# Patient Record
Sex: Female | Born: 1970 | Race: Asian | Hispanic: No | Marital: Married | Smoking: Never smoker
Health system: Southern US, Community
[De-identification: ages and names within clinical notes are randomized; demographics above are authoritative.]

## PROBLEM LIST (undated history)

## (undated) ENCOUNTER — Inpatient Hospital Stay (HOSPITAL_COMMUNITY): Payer: Self-pay

## (undated) DIAGNOSIS — O2340 Unspecified infection of urinary tract in pregnancy, unspecified trimester: Secondary | ICD-10-CM

## (undated) HISTORY — PX: NO PAST SURGERIES: SHX2092

---

## 2010-02-24 ENCOUNTER — Ambulatory Visit: Payer: Self-pay | Admitting: Physician Assistant

## 2010-02-24 DIAGNOSIS — K219 Gastro-esophageal reflux disease without esophagitis: Secondary | ICD-10-CM | POA: Insufficient documentation

## 2010-02-24 DIAGNOSIS — R319 Hematuria, unspecified: Secondary | ICD-10-CM

## 2010-02-24 LAB — CONVERTED CEMR LAB
BUN: 12 mg/dL (ref 6–23)
Basophils Relative: 0 % (ref 0–1)
Bilirubin Urine: NEGATIVE
CO2: 25 meq/L (ref 19–32)
Calcium: 8.7 mg/dL (ref 8.4–10.5)
Chloride: 104 meq/L (ref 96–112)
Creatinine, Ser: 0.65 mg/dL (ref 0.40–1.20)
Crystals: NONE SEEN
Glucose, Urine, Semiquant: NEGATIVE
HCT: 37.3 % (ref 36.0–46.0)
Hemoglobin: 12.1 g/dL (ref 12.0–15.0)
Ketones, urine, test strip: NEGATIVE
Lymphocytes Relative: 34 % (ref 12–46)
MCHC: 32.4 g/dL (ref 30.0–36.0)
Monocytes Absolute: 0.3 10*3/uL (ref 0.1–1.0)
Monocytes Relative: 7 % (ref 3–12)
Neutro Abs: 2.3 10*3/uL (ref 1.7–7.7)
Protein, U semiquant: NEGATIVE
RBC: 5.41 M/uL — ABNORMAL HIGH (ref 3.87–5.11)
Specific Gravity, Urine: 1.015
TSH: 0.5 microintl units/mL (ref 0.350–4.500)

## 2010-02-25 ENCOUNTER — Encounter: Payer: Self-pay | Admitting: Physician Assistant

## 2010-02-25 DIAGNOSIS — R799 Abnormal finding of blood chemistry, unspecified: Secondary | ICD-10-CM

## 2010-02-26 ENCOUNTER — Ambulatory Visit: Payer: Self-pay | Admitting: Physician Assistant

## 2010-03-05 ENCOUNTER — Encounter (INDEPENDENT_AMBULATORY_CARE_PROVIDER_SITE_OTHER): Payer: Self-pay | Admitting: *Deleted

## 2010-03-05 ENCOUNTER — Telehealth: Payer: Self-pay | Admitting: Physician Assistant

## 2010-03-11 DIAGNOSIS — D568 Other thalassemias: Secondary | ICD-10-CM | POA: Insufficient documentation

## 2010-03-11 LAB — CONVERTED CEMR LAB
Hgb F Quant: 0 % (ref 0.0–2.0)
Hgb S Quant: 0 % (ref 0.0–0.0)

## 2010-03-15 ENCOUNTER — Encounter: Payer: Self-pay | Admitting: Physician Assistant

## 2010-03-23 ENCOUNTER — Ambulatory Visit: Payer: Self-pay | Admitting: Physician Assistant

## 2010-03-23 DIAGNOSIS — K5900 Constipation, unspecified: Secondary | ICD-10-CM | POA: Insufficient documentation

## 2010-03-23 LAB — CONVERTED CEMR LAB
Bilirubin Urine: NEGATIVE
Glucose, Urine, Semiquant: NEGATIVE
Specific Gravity, Urine: <1.005
TIBC: 321 ug/dL (ref 250–470)
UIBC: 253 ug/dL
Urobilinogen, UA: 0.2
pH: 6.5

## 2010-03-25 ENCOUNTER — Encounter: Payer: Self-pay | Admitting: Physician Assistant

## 2010-03-28 ENCOUNTER — Telehealth: Payer: Self-pay | Admitting: Physician Assistant

## 2010-03-31 ENCOUNTER — Encounter: Payer: Self-pay | Admitting: Physician Assistant

## 2010-05-13 ENCOUNTER — Encounter: Payer: Self-pay | Admitting: Physician Assistant

## 2010-05-13 LAB — CONVERTED CEMR LAB
Bilirubin Urine: NEGATIVE
Protein, U semiquant: NEGATIVE
Urobilinogen, UA: 0.2

## 2010-05-14 ENCOUNTER — Encounter: Payer: Self-pay | Admitting: Physician Assistant

## 2010-05-14 LAB — CONVERTED CEMR LAB: Crystals: NONE SEEN

## 2010-05-20 ENCOUNTER — Encounter (INDEPENDENT_AMBULATORY_CARE_PROVIDER_SITE_OTHER): Payer: Self-pay | Admitting: *Deleted

## 2010-06-17 ENCOUNTER — Encounter (INDEPENDENT_AMBULATORY_CARE_PROVIDER_SITE_OTHER): Payer: Self-pay | Admitting: Nurse Practitioner

## 2010-08-25 ENCOUNTER — Ambulatory Visit: Admission: RE | Admit: 2010-08-25 | Payer: Self-pay | Source: Home / Self Care | Admitting: Nurse Practitioner

## 2010-08-25 ENCOUNTER — Encounter (INDEPENDENT_AMBULATORY_CARE_PROVIDER_SITE_OTHER): Payer: Self-pay | Admitting: Nurse Practitioner

## 2010-08-31 NOTE — Assessment & Plan Note (Signed)
Summary: Pt. not seen . . .no interpreter   Vital Signs:  Patient profile:   40 year old female Height:      60.51 inches Weight:      109.1 pounds BMI:     21.03 Temp:     96.6 degrees F oral Pulse rate:   68 / minute Pulse rhythm:   regular Resp:     18 per minute BP sitting:   102 / 72  (left arm) Cuff size:   small  Vitals Entered By: Linzie Collin                                                                                                                                                                                     Allergies: No Known Drug Allergies   Complete Medication List: 1)  Pepcid 20 Mg Tabs (Famotidine) .... Take 1 tablet by mouth two times a day 2)  Miralax Powd (Polyethylene glycol 3350) .... Dissolve one capful in a glass of water and drink one glass daily for constipation  Laboratory Results   Urine Tests    Routine Urinalysis   Color: yellow Glucose: negative   (Normal Range: Negative) Bilirubin: negative   (Normal Range: Negative) Ketone: negative   (Normal Range: Negative) Spec. Gravity: <1.005   (Normal Range: 1.003-1.035) Blood: trace-intact   (Normal Range: Negative) pH: 5.5   (Normal Range: 5.0-8.0) Protein: negative   (Normal Range: Negative) Urobilinogen: 0.2   (Normal Range: 0-1) Nitrite: negative   (Normal Range: Negative) Leukocyte Esterace: trace   (Normal Range: Negative)

## 2010-08-31 NOTE — Progress Notes (Signed)
  Phone Note Outgoing Call   Summary of Call: Please notify patient that test for bacteria in stomach that causes ulcers is negative. She can continue with the pepcid.  Initial call taken by: Brynda Rim,  March 05, 2010 2:18 PM  Follow-up for Phone Call        will mail letter because of the language barrier... the caller said she is not there/ dont live there... i will send letter Follow-up by: [name],  March 05, 2010 3:55 PM

## 2010-08-31 NOTE — Letter (Signed)
Summary: //[employer] LETTER/FLAGGED STAFF  //[employer] LETTER/FLAGGED STAFF   Imported By: Silvio Pate Stanislawscyk 06/17/2010 14:28:47  _____________________________________________________________________  External Attachment:    Type:   Image     Comment:   External Document

## 2010-08-31 NOTE — Assessment & Plan Note (Signed)
Summary: NEW/PAIN IN SIDE//KT   Vital Signs:  Patient profile:   40 year old female LMP:     01/31/2010 Weight:      110 pounds Temp:     97.4 degrees F oral Pulse rate:   73 / minute Pulse rhythm:   regular Resp:     18 per minute BP sitting:   105 / 64  (left arm) Cuff size:   regular  Vitals Entered By: [name] (February 24, 2010 2:33 PM) CC: NP... pt says she is here for stomach pain...Marland KitchenMarland Kitchen pt says the pain is right below the left breast... Is Patient Diabetic? No Pain Assessment Patient in pain? no       Does patient need assistance? Functional Status Self care Ambulation Normal LMP (date): 01/31/2010     Enter LMP: 01/31/2010   Primary Care Provider:  Tereso Newcomer PA-C  CC:  NP... pt says she is here for stomach pain...Marland KitchenMarland Kitchen pt says the pain is right below the left breast....  History of Present Illness: New patient.  From [place].  Here in [place] for 4 months.  Here with nieces.  Does not speak [place]. Health maint: Has never had a pap. Not sure if she had Tb test. Does not think she went to Health [facility].  Patient notes epigastric and LUQ pain for 10 years.  Worse with spicy foods.  Notes some burning in her chest.  Hurts to take a deep breath at times.  No dysphagia.  No melena or hematochezia.  No hematemesis.  No weight loss.  No fevers.    Habits & Providers  Alcohol-Tobacco-Diet     Tobacco Status: never  Exercise-Depression-Behavior     Drug Use: no  Current Medications (verified): 1)  None  Allergies (verified): No Known Drug Allergies  Past History:  Past Medical History: Unremarkable  Past Surgical History: Denies surgical history  Family History: denies  Social History: unemployed Married 1 kid Never Smoked Alcohol use-no Drug use-no Smoking Status:  never Drug Use:  no  Review of Systems      See HPI General:  See HPI. CV:  Denies chest pain or discomfort and shortness of breath with exertion. GI:  Complains of diarrhea  and gas. GU:  Denies dysuria. Derm:  Complains of lesion(s).  Physical Exam  General:  alert, well-developed, and well-nourished.   Head:  normocephalic and atraumatic.   Eyes:  pupils equal, pupils round, pupils reactive to light, and no injection.   Ears:  R ear normal and L ear normal.   scarring noted bilat Nose:  no external deformity.   Mouth:  pharynx pink and moist.   Neck:  supple.   Lungs:  normal breath sounds, no crackles, and no wheezes.   Heart:  normal rate and regular rhythm.   Abdomen:  soft, normal bowel sounds, no hepatomegaly, and mild epigastric tenderness.   Msk:  no CVA tenderness Neurologic:  alert & oriented X3 and cranial nerves II-XII intact.   Psych:  normally interactive.     Impression & Recommendations:  Problem # 1:  GERD (ICD-530.81)  start pepcid check labs if Eos up, check for O&P if H. pylori IgM +, treat for H. pylori f/u with me to go over labs and symptoms in 2-3 weeks (will bring back early due to language barrier)  Orders: T-Comprehensive Metabolic Panel ([id number]) T-CBC w/Diff ([id number]) T-H Pylori Antibody IgM ([id number])  Her updated medication list for this problem includes:    Pepcid 20  Mg Tabs (Famotidine) .Marland Kitchen... Take 1 tablet by mouth two times a day  Problem # 2:  PREVENTIVE HEALTH CARE (ICD-V70.0)  will eventually need to arrange CPP not sure if she had PPD . . .will get here  Orders: T-Comprehensive Metabolic Panel [id number]) T-TSH [id number])  Problem # 3:  HEMATURIA UNSPECIFIED (ICD-599.70) no dysuria check cx and micro  Orders: T-Culture, Urine ([id number]) T- * Misc. Laboratory test [phone])  Complete Medication List: 1)  Pepcid 20 Mg Tabs (Famotidine) .... Take 1 tablet by mouth two times a day  Patient Instructions: 1)  Place PPD. 2)  Take Pepcid two times a day every day.  When your run out, get it refilled at the pharmacy and continue unless I tell you to stop. 3)  Please schedule a  follow-up appointment in 2-3 weeks with Vallarie Fei to go over labs and symptoms.  Prescriptions: PEPCID 20 MG TABS (FAMOTIDINE) Take 1 tablet by mouth two times a day  #60 x 3   Entered and Authorized by:   Tereso Newcomer PA-C   Signed by:   Tereso Newcomer PA-C on 02/24/2010   Method used:   Print then Give to Patient   RxID:   [id number]   Laboratory Results   Urine Tests    Routine Urinalysis   Glucose: negative   (Normal Range: Negative) Bilirubin: negative   (Normal Range: Negative) Ketone: negative   (Normal Range: Negative) Spec. Gravity: 1.015   (Normal Range: 1.003-1.035) Blood: moderate   (Normal Range: Negative) pH: 6.0   (Normal Range: 5.0-8.0) Protein: negative   (Normal Range: Negative) Urobilinogen: 0.2   (Normal Range: 0-1) Nitrite: negative   (Normal Range: Negative) Leukocyte Esterace: trace   (Normal Range: Negative)        Appended Document: NEW/PAIN IN SIDE//KT  Laboratory Results   Urine Tests      Urine HCG: negative

## 2010-08-31 NOTE — Letter (Signed)
Summary: Generic Letter  [facility]  [address] [place], [place] [address]   Phone: [phone]  Fax: [phone]        03/31/2010  Anyssa Migues [address] [place], [place]  [address]  Dear Ms. Vicki Mallet,  We have been unable to contact you by telephone.  Please call our office, at your earliest convenience, so that we may speak with you.   Sincerely,   Dutch Quint RN

## 2010-08-31 NOTE — Letter (Signed)
Summary: *HSN Results Follow up  [facility]  [address] [place], [place] [address]   Phone: [phone]  Fax: [phone]      03/05/2010   [name] [address] [place], [place]  [address]   Dear  [name],                            ____S.Drinkard,FNP   ____D. Gore,FNP       ____B. McPherson,MD   ____V. Rankins,MD    ____E. Mulberry,MD    ____N. Daphine Deutscher, FNP  ____D. Reche Dixon, MD    ____K. Philipp Deputy, MD    ____Other     This letter is to inform you that your recent test(s):  _______Pap Smear    ___X____Lab Test     _______X-ray    ___X____ is within acceptable limits  _______ requires a medication change  _______ requires a follow-up lab visit  _______ requires a follow-up visit with your provider   Comments: We have been trying to contact you.  Please give the office a call at your earliest convenience.       _________________________________________________________ If you have any questions, please contact our office                     Sincerely,  [name] [facility]

## 2010-08-31 NOTE — Letter (Signed)
Summary: PT INFORMATION SHEET  PT INFORMATION SHEET   Imported By: Arta Bruce 02/25/2010 16:00:44  _____________________________________________________________________  External Attachment:    Type:   Image     Comment:   External Document

## 2010-08-31 NOTE — Letter (Signed)
Summary: *HSN Results Follow up  [facility]  [address] [place], [place] [address]   Phone: [phone]  Fax: [phone]      05/20/2010   Molly Guerrero [address] [place], [place]  [address]   Dear  Ms. [name],                            ____S.Drinkard,FNP   ____D. Gore,FNP       ____B. McPherson,MD   ____V. Rankins,MD    ____E. Mulberry,MD    ____N. Daphine Deutscher, FNP  ____D. Reche Dixon, MD    ____K. Philipp Deputy, MD    ____Other     This letter is to inform you that your recent test(s):  _______Pap Smear    ___X____Lab Test     _______X-ray    _______ is within acceptable limits  ___X____ requires a medication change  _______ requires a follow-up lab visit  _______ requires a follow-up visit with your provider   Comments: We have been trying to reach you.  Please give the office a call at your earliest convenience.       _________________________________________________________ If you have any questions, please contact our office                     Sincerely,  [name] [facility]

## 2010-08-31 NOTE — Assessment & Plan Note (Signed)
Summary: GERD; Constipation   Vital Signs:  Patient profile:   40 year old female Height:      60.51 inches Weight:      109 pounds BMI:     21.01 Temp:     97.5 degrees F oral Pulse rate:   74 / minute Pulse rhythm:   regular Resp:     18 per minute BP sitting:   100 / 63  (left arm) Cuff size:   regular  Vitals Entered By: CMA Student CC: 2 week F/U, patient states apin in lower abdomen area and pelvis during urination,experiencing fatigue, after takening medication her mouth becomes dry Is Patient Diabetic? No Pain Assessment Patient in pain? no       Does patient need assistance? Functional Status Self care Ambulation Normal   Primary Care Provider:  Tereso Newcomer PA-C  CC:  2 week F/U, patient states apin in lower abdomen area and pelvis during urination, experiencing fatigue, and after takening medication her mouth becomes dry.  History of Present Illness: Here for f/u.  Started pepcid and feels like stomach better.  She had labs dones.  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  Needs iron studies.  CMET ok.  Neg H. pylori.  Treated for UTI.  Repeat u/a today ok. Notes problems with constipation for years.  Has a lot of pelvic pressure.  Has to strain to have bowel movement.  Has had problem for 18 years.  Has BM daily but has to strain.  She has noted some blood on her stool after a lot of straining.  Notes a lot of pain with this.   No weight loss.  No vomiting. States she used to see "worms" in stool in [place].  Has not noticed here.  Current Medications (verified): 1)  Pepcid 20 Mg Tabs (Famotidine) .... Take 1 Tablet By Mouth Two Times A Day  Allergies (verified): No Known Drug Allergies  Physical Exam  General:  alert, well-developed, and well-nourished.   Head:  normocephalic and atraumatic.   Neck:  supple.   Lungs:  normal breath sounds, no crackles, and no wheezes.   Heart:  normal rate and regular rhythm.   Neurologic:  alert & oriented X3 and cranial  nerves II-XII intact.   Psych:  normally interactive.     Impression & Recommendations:  Problem # 1:  GERD (ICD-530.81) improved with pepcid  continue pepcid  Her updated medication list for this problem includes:    Pepcid 20 Mg Tabs (Famotidine) .Marland Kitchen... Take 1 tablet by mouth two times a day  Problem # 2:  CONSTIPATION (ICD-564.00)  states she has seen worms will send stool for O&P  Orders: T-Ova and Parasites (60630)  Her updated medication list for this problem includes:    Miralax Powd (Polyethylene glycol 3350) .Marland Kitchen... 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 Take 1 tablet by mouth two times a day 2)  Miralax Powd (Polyethylene glycol 3350) .... Dissolve one capful in a glass of water and drink one glass daily for constipation  Other Orders: UA Dipstick w/o Micro (manual) (31517)  Patient Instructions: 1)  Follow high fiber diet. 2)  Use miralax  daily.  Dissolve one capful in glass of water and drink one glass a day. 3)  Please schedule a follow-up appointment in 2 months with Scott for CPP.   Arrange interpreter if possible. Prescriptions: MIRALAX  POWD (POLYETHYLENE GLYCOL 3350) dissolve one capful in a glass of water and drink one glass daily for constipation  #1 bottle x 3   Entered and Authorized by:   Tereso Newcomer PA-C   Signed by:   Tereso Newcomer PA-C on 03/23/2010   Method used:   Print then Give to Patient   RxID:   [id number]   Laboratory Results   Urine Tests    Routine Urinalysis   Color: yellow Glucose: negative   (Normal Range: Negative) Bilirubin: negative   (Normal Range:  Negative) Ketone: negative   (Normal Range: Negative) Spec. Gravity: <1.005   (Normal Range: 1.003-1.035) Blood: negative   (Normal Range: Negative) pH: 6.5   (Normal Range: 5.0-8.0) Protein: negative   (Normal Range: Negative) Urobilinogen: 0.2   (Normal Range: 0-1) Nitrite: negative   (Normal Range: Negative) Leukocyte Esterace: negative   (Normal Range: Negative)

## 2010-08-31 NOTE — Progress Notes (Signed)
Summary: Lab f/u  Phone Note Outgoing Call   Summary of Call: Notify Raneem Wachs that her iron levels are ok SHe did not have any worms in her stool sample. Initial call taken by: Brynda Rim,  March 28, 2010 9:22 PM  Follow-up for Phone Call        Unable to leave message -- no answering device.  Dutch Quint RN  March 30, 2010 9:37 AM  Unable to leave message -- no answering device.  Dutch Quint RN  March 31, 2010 12:36 PM  Aunt answered phone -- doesn't know when she'll be there.  Gave me another [phone] -- person who answered said "wrong number."  Letter sent.  Dutch Quint RN  March 31, 2010 4:55 PM

## 2010-09-02 NOTE — Assessment & Plan Note (Signed)
Summary: Reschedule appointment   Medicaid is no longer active. Pt not able to be seen.   Allergies: No Known Drug Allergies   Complete Medication List: 1)  Pepcid 20 Mg Tabs (Famotidine) .... Take 1 tablet by mouth two times a day 2)  Miralax Powd (Polyethylene glycol 3350) .... Dissolve one capful in a glass of water and drink one glass daily for constipation

## 2010-09-13 ENCOUNTER — Emergency Department (HOSPITAL_COMMUNITY)
Admission: EM | Admit: 2010-09-13 | Discharge: 2010-09-13 | Disposition: A | Discharge status: Home / Self Care | Payer: Self-pay | Attending: Emergency Medicine | Admitting: Emergency Medicine

## 2010-09-13 DIAGNOSIS — W540XXA Bitten by dog, initial encounter: Secondary | ICD-10-CM | POA: Insufficient documentation

## 2010-09-13 DIAGNOSIS — S5000XA Contusion of unspecified elbow, initial encounter: Secondary | ICD-10-CM | POA: Insufficient documentation

## 2010-09-13 DIAGNOSIS — M25529 Pain in unspecified elbow: Secondary | ICD-10-CM | POA: Insufficient documentation

## 2010-09-13 DIAGNOSIS — S51009A Unspecified open wound of unspecified elbow, initial encounter: Secondary | ICD-10-CM | POA: Insufficient documentation

## 2010-09-16 ENCOUNTER — Inpatient Hospital Stay (INDEPENDENT_AMBULATORY_CARE_PROVIDER_SITE_OTHER)
Admission: RE | Admit: 2010-09-16 | Discharge: 2010-09-16 | Disposition: A | Discharge status: Home / Self Care | Payer: Self-pay | Source: Ambulatory Visit

## 2010-09-16 DIAGNOSIS — Z23 Encounter for immunization: Secondary | ICD-10-CM

## 2010-09-20 ENCOUNTER — Inpatient Hospital Stay (INDEPENDENT_AMBULATORY_CARE_PROVIDER_SITE_OTHER)
Admission: RE | Admit: 2010-09-20 | Discharge: 2010-09-20 | Disposition: A | Discharge status: Home / Self Care | Payer: Self-pay | Source: Ambulatory Visit | Attending: Emergency Medicine | Admitting: Emergency Medicine

## 2010-09-20 DIAGNOSIS — Z23 Encounter for immunization: Secondary | ICD-10-CM

## 2010-09-27 ENCOUNTER — Inpatient Hospital Stay (INDEPENDENT_AMBULATORY_CARE_PROVIDER_SITE_OTHER)
Admission: RE | Admit: 2010-09-27 | Discharge: 2010-09-27 | Disposition: A | Discharge status: Home / Self Care | Payer: Self-pay | Source: Ambulatory Visit | Attending: Emergency Medicine | Admitting: Emergency Medicine

## 2010-09-27 DIAGNOSIS — Z23 Encounter for immunization: Secondary | ICD-10-CM

## 2010-11-29 ENCOUNTER — Inpatient Hospital Stay (INDEPENDENT_AMBULATORY_CARE_PROVIDER_SITE_OTHER)
Admission: RE | Admit: 2010-11-29 | Discharge: 2010-11-29 | Disposition: A | Discharge status: Home / Self Care | Payer: Self-pay | Source: Ambulatory Visit | Attending: Family Medicine | Admitting: Family Medicine

## 2010-11-29 ENCOUNTER — Inpatient Hospital Stay (HOSPITAL_COMMUNITY): Payer: Self-pay

## 2010-11-29 ENCOUNTER — Inpatient Hospital Stay (HOSPITAL_COMMUNITY)
Admission: AD | Admit: 2010-11-29 | Discharge: 2010-11-29 | Disposition: A | Discharge status: Home / Self Care | Payer: Self-pay | Source: Ambulatory Visit | Attending: Obstetrics & Gynecology | Admitting: Obstetrics & Gynecology

## 2010-11-29 DIAGNOSIS — O200 Threatened abortion: Secondary | ICD-10-CM

## 2010-11-29 DIAGNOSIS — O209 Hemorrhage in early pregnancy, unspecified: Secondary | ICD-10-CM | POA: Insufficient documentation

## 2010-11-29 LAB — DIFFERENTIAL
Eosinophils Absolute: 0 10*3/uL (ref 0.0–0.7)
Monocytes Absolute: 0.6 10*3/uL (ref 0.1–1.0)
Neutrophils Relative %: 79 % — ABNORMAL HIGH (ref 43–77)

## 2010-11-29 LAB — POCT PREGNANCY, URINE: Preg Test, Ur: POSITIVE

## 2010-11-29 LAB — CBC
MCHC: 33.3 g/dL (ref 30.0–36.0)
MCV: 65.6 fL — ABNORMAL LOW (ref 78.0–100.0)
Platelets: 383 10*3/uL (ref 150–400)
RDW: 13.8 % (ref 11.5–15.5)
WBC: 8.3 10*3/uL (ref 4.0–10.5)

## 2010-11-29 LAB — POCT URINALYSIS DIP (DEVICE)
Bilirubin Urine: NEGATIVE
Glucose, UA: NEGATIVE mg/dL
Nitrite: NEGATIVE

## 2010-11-30 LAB — GC/CHLAMYDIA PROBE AMP, GENITAL: GC Probe Amp, Genital: NEGATIVE

## 2010-11-30 NOTE — Progress Notes (Signed)
Results faxed to [facility]

## 2010-12-01 ENCOUNTER — Inpatient Hospital Stay (HOSPITAL_COMMUNITY)
Admission: AD | Admit: 2010-12-01 | Discharge: 2010-12-01 | Disposition: A | Discharge status: Home / Self Care | Payer: Self-pay | Source: Ambulatory Visit | Attending: Obstetrics and Gynecology | Admitting: Obstetrics and Gynecology

## 2010-12-01 DIAGNOSIS — O209 Hemorrhage in early pregnancy, unspecified: Secondary | ICD-10-CM | POA: Insufficient documentation

## 2010-12-02 NOTE — Telephone Encounter (Signed)
[facility]

## 2010-12-09 ENCOUNTER — Ambulatory Visit: Payer: Self-pay | Admitting: Obstetrics and Gynecology

## 2010-12-20 ENCOUNTER — Encounter: Payer: Self-pay | Admitting: Physician Assistant

## 2011-04-29 ENCOUNTER — Inpatient Hospital Stay (HOSPITAL_COMMUNITY)
Admission: AD | Admit: 2011-04-29 | Discharge: 2011-04-29 | Disposition: A | Discharge status: Home / Self Care | Payer: Medicaid Other | Source: Ambulatory Visit | Attending: Obstetrics & Gynecology | Admitting: Obstetrics & Gynecology

## 2011-04-29 ENCOUNTER — Encounter (HOSPITAL_COMMUNITY): Payer: Self-pay | Admitting: *Deleted

## 2011-04-29 DIAGNOSIS — Z3201 Encounter for pregnancy test, result positive: Secondary | ICD-10-CM

## 2011-04-29 DIAGNOSIS — O210 Mild hyperemesis gravidarum: Secondary | ICD-10-CM | POA: Insufficient documentation

## 2011-04-29 LAB — POCT PREGNANCY, URINE: Preg Test, Ur: POSITIVE

## 2011-04-29 LAB — URINALYSIS, ROUTINE W REFLEX MICROSCOPIC
Glucose, UA: NEGATIVE mg/dL
Leukocytes, UA: NEGATIVE
Specific Gravity, Urine: 1.01 (ref 1.005–1.030)
pH: 6.5 (ref 5.0–8.0)

## 2011-04-29 LAB — URINE MICROSCOPIC-ADD ON

## 2011-04-29 MED ORDER — PROMETHAZINE HCL 25 MG PO TABS: 25.0000 mg | ORAL_TABLET | Freq: Four times a day (QID) | ORAL | Status: DC | PRN

## 2011-04-29 NOTE — ED Notes (Signed)
Henrietta Hoover PA in to see pt

## 2011-04-29 NOTE — Progress Notes (Signed)
G2P1 Sabx1. LMP end of August but unsure of date. Has had n/v since August and thinks may be pregnant. Is very tired. Throws up 3-4 times daily.

## 2011-04-29 NOTE — ED Provider Notes (Signed)
History   Pt presents today with her own interpreter. She states she has been having N&V and thinks she may be pregnant. She denies abd pain, vag dc, or bleeding.  Chief Complaint  Patient presents with  . Emesis   HPI  OB History    Grav Para Term Preterm Abortions TAB SAB Ect Mult Living   2 1 1  0 1 0 1 0 0 1      Past Medical History  Diagnosis Date  . No pertinent past medical history     Past Surgical History  Procedure Date  . No past surgeries     No family history on file.  History  Substance Use Topics  . Smoking status: Never Smoker   . Smokeless tobacco: Not on file  . Alcohol Use: No    Allergies: No Known Allergies  No prescriptions prior to admission    Review of Systems  Constitutional: Positive for malaise/fatigue. Negative for fever.  Cardiovascular: Negative for chest pain.  Gastrointestinal: Positive for nausea and vomiting. Negative for abdominal pain, diarrhea and constipation.  Genitourinary: Negative for dysuria, urgency, frequency, hematuria and flank pain.  Neurological: Negative for dizziness and headaches.  Psychiatric/Behavioral: Negative for depression and suicidal ideas.   Physical Exam   Blood pressure 109/61, pulse 65, temperature 97 F (36.1 C), resp. rate 20, height 5\' 3"  (1.6 m), last menstrual period 03/31/2011.  Physical Exam  Constitutional: She is oriented to person, place, and time. She appears well-developed and well-nourished. No distress.  HENT:  Head: Normocephalic and atraumatic.  Eyes: EOM are normal. Pupils are equal, round, and reactive to light.  GI: Soft. She exhibits no distension. There is no tenderness. There is no rebound and no guarding.  Neurological: She is alert and oriented to person, place, and time.  Skin: Skin is warm and dry. She is not diaphoretic.  Psychiatric: She has a normal mood and affect. Her behavior is normal. Judgment and thought content normal.    [name] Course   Procedures  Results for orders placed during the hospital encounter of 04/29/11 (from the past 24 hour(s))  POCT PREGNANCY, URINE     Status: Normal   Collection Time   04/29/11  7:26 PM      Component Value Range   Preg Test, Ur POSITIVE    URINALYSIS, ROUTINE W REFLEX MICROSCOPIC     Status: Abnormal   Collection Time   04/29/11  7:30 PM      Component Value Range   Color, Urine STRAW (*) YELLOW    Appearance CLEAR  CLEAR    Specific Gravity, Urine 1.010  1.005 - 1.030    pH 6.5  5.0 - 8.0    Glucose, UA NEGATIVE  NEGATIVE (mg/dL)   Hgb urine dipstick MODERATE (*) NEGATIVE    Bilirubin Urine NEGATIVE  NEGATIVE    Ketones, ur NEGATIVE  NEGATIVE (mg/dL)   Protein, ur NEGATIVE  NEGATIVE (mg/dL)   Urobilinogen, UA 0.2  0.0 - 1.0 (mg/dL)   Nitrite NEGATIVE  NEGATIVE    Leukocytes, UA NEGATIVE  NEGATIVE   URINE MICROSCOPIC-ADD ON     Status: Abnormal   Collection Time   04/29/11  7:30 PM      Component Value Range   Squamous Epithelial / LPF FEW (*) RARE    WBC, UA 0-2  <3 (WBC/hpf)   RBC / HPF 7-10  <3 (RBC/hpf)   Bacteria, UA FEW (*) RARE     Urine sent for culture.  Assessment and Plan  Pregnancy: discussed with pt at length. Will give Rx for phenergan. She will begin prenatal care. Discussed diet, activity, risks, and precautions.  Clinton Gallant. Arin Vanosdol III, DrHSc, MPAS, PA-C  04/29/2011, 8:06 PM   Henrietta Hoover, PA 04/29/11 2009

## 2011-04-29 NOTE — Progress Notes (Signed)
Written and verbal d/c instructions  Given and understanding voiced

## 2011-04-29 NOTE — Progress Notes (Signed)
Pt is from [place] with limited English. Has friend with her she wants to interpret

## 2011-05-01 NOTE — ED Provider Notes (Signed)
Agree with above note.  Molly Sylvester H. 05/01/2011 3:12 PM

## 2011-05-03 LAB — URINE CULTURE: Culture  Setup Time: 201209290527

## 2011-05-16 ENCOUNTER — Other Ambulatory Visit (HOSPITAL_COMMUNITY): Payer: Self-pay | Admitting: Family

## 2011-05-16 DIAGNOSIS — O26849 Uterine size-date discrepancy, unspecified trimester: Secondary | ICD-10-CM

## 2011-05-16 LAB — ANTIBODY SCREEN: Antibody Screen: NEGATIVE

## 2011-05-16 LAB — RPR: RPR: NONREACTIVE

## 2011-05-16 LAB — HEPATITIS B SURFACE ANTIGEN: Hepatitis B Surface Ag: NEGATIVE

## 2011-05-20 ENCOUNTER — Ambulatory Visit (HOSPITAL_COMMUNITY): Admission: RE | Admit: 2011-05-20 | Payer: Medicaid Other | Source: Ambulatory Visit

## 2011-05-23 ENCOUNTER — Ambulatory Visit (HOSPITAL_COMMUNITY)
Admission: RE | Admit: 2011-05-23 | Discharge: 2011-05-23 | Disposition: A | Discharge status: Home / Self Care | Payer: Medicaid Other | Source: Ambulatory Visit | Attending: Family | Admitting: Family

## 2011-05-23 DIAGNOSIS — Z3689 Encounter for other specified antenatal screening: Secondary | ICD-10-CM | POA: Insufficient documentation

## 2011-05-23 DIAGNOSIS — O09529 Supervision of elderly multigravida, unspecified trimester: Secondary | ICD-10-CM | POA: Insufficient documentation

## 2011-05-23 DIAGNOSIS — O26849 Uterine size-date discrepancy, unspecified trimester: Secondary | ICD-10-CM

## 2011-08-02 NOTE — L&D Delivery Note (Signed)
Delivery Note At 10:41 PM a viable female was delivered via Vaginal, Spontaneous Delivery (Presentation: ; Occiput Anterior).  Compound right arm, APGAR: 8, 9; weight 4 lb 8.7 oz (2060 g).   Placenta status: Intact, Spontaneous.  Cord: 3 vessels with the following complications: loose nuchal. No difficulty w/ shoulders. Cord pH: NA  Anesthesia: None  Episiotomy: None Lacerations: Deep 2nd degree Suture Repair: 3.0 vicryl rapide Est. Blood Loss (mL): 600  Mom to postpartum.  Baby to nursery-stable. Placenta to: Pathology for SGA Feeding: breast Circ: NA Contraception: Sherlynn Stalls, Zinedine Ellner 12/02/2011, 12:11 AM

## 2011-08-05 ENCOUNTER — Other Ambulatory Visit (HOSPITAL_COMMUNITY): Payer: Self-pay | Admitting: Nurse Practitioner

## 2011-08-05 DIAGNOSIS — Z3689 Encounter for other specified antenatal screening: Secondary | ICD-10-CM

## 2011-08-08 ENCOUNTER — Other Ambulatory Visit (HOSPITAL_COMMUNITY): Payer: Self-pay | Admitting: Nurse Practitioner

## 2011-08-09 ENCOUNTER — Ambulatory Visit (HOSPITAL_COMMUNITY)
Admission: RE | Admit: 2011-08-09 | Discharge: 2011-08-09 | Disposition: A | Discharge status: Home / Self Care | Payer: Medicaid Other | Source: Ambulatory Visit | Attending: Nurse Practitioner | Admitting: Nurse Practitioner

## 2011-08-09 DIAGNOSIS — O358XX0 Maternal care for other (suspected) fetal abnormality and damage, not applicable or unspecified: Secondary | ICD-10-CM | POA: Insufficient documentation

## 2011-08-09 DIAGNOSIS — Z1389 Encounter for screening for other disorder: Secondary | ICD-10-CM | POA: Insufficient documentation

## 2011-08-09 DIAGNOSIS — Z3689 Encounter for other specified antenatal screening: Secondary | ICD-10-CM

## 2011-08-09 DIAGNOSIS — O09529 Supervision of elderly multigravida, unspecified trimester: Secondary | ICD-10-CM | POA: Insufficient documentation

## 2011-08-09 DIAGNOSIS — Z363 Encounter for antenatal screening for malformations: Secondary | ICD-10-CM | POA: Insufficient documentation

## 2011-09-03 ENCOUNTER — Inpatient Hospital Stay (HOSPITAL_COMMUNITY): Payer: Medicaid Other

## 2011-09-03 ENCOUNTER — Encounter (HOSPITAL_COMMUNITY): Payer: Self-pay | Admitting: Obstetrics and Gynecology

## 2011-09-03 ENCOUNTER — Inpatient Hospital Stay (HOSPITAL_COMMUNITY)
Admission: AD | Admit: 2011-09-03 | Discharge: 2011-09-06 | DRG: 781 | Disposition: A | Discharge status: Home / Self Care | Payer: Medicaid Other | Source: Ambulatory Visit | Attending: Family Medicine | Admitting: Family Medicine

## 2011-09-03 DIAGNOSIS — A498 Other bacterial infections of unspecified site: Secondary | ICD-10-CM | POA: Diagnosis present

## 2011-09-03 DIAGNOSIS — N939 Abnormal uterine and vaginal bleeding, unspecified: Secondary | ICD-10-CM

## 2011-09-03 DIAGNOSIS — N390 Urinary tract infection, site not specified: Secondary | ICD-10-CM | POA: Diagnosis present

## 2011-09-03 DIAGNOSIS — O4700 False labor before 37 completed weeks of gestation, unspecified trimester: Secondary | ICD-10-CM | POA: Diagnosis present

## 2011-09-03 DIAGNOSIS — N898 Other specified noninflammatory disorders of vagina: Secondary | ICD-10-CM

## 2011-09-03 DIAGNOSIS — O2390 Unspecified genitourinary tract infection in pregnancy, unspecified trimester: Principal | ICD-10-CM | POA: Diagnosis present

## 2011-09-03 DIAGNOSIS — O09529 Supervision of elderly multigravida, unspecified trimester: Secondary | ICD-10-CM | POA: Diagnosis present

## 2011-09-03 LAB — URINE MICROSCOPIC-ADD ON

## 2011-09-03 LAB — URINALYSIS, ROUTINE W REFLEX MICROSCOPIC
Bilirubin Urine: NEGATIVE
Ketones, ur: NEGATIVE mg/dL
Specific Gravity, Urine: <1.005 — ABNORMAL LOW (ref 1.005–1.030)
Urobilinogen, UA: 0.2 mg/dL (ref 0.0–1.0)

## 2011-09-03 MED ORDER — MAGNESIUM SULFATE 40 G IN LACTATED RINGERS - SIMPLE
2.0000 g/h | INTRAVENOUS | Status: DC
  Administered 2011-09-04: 2 g/h via INTRAVENOUS
  Filled 2011-09-03 (×2): qty 500

## 2011-09-03 MED ORDER — ACETAMINOPHEN 325 MG PO TABS: 650.0000 mg | ORAL_TABLET | ORAL | Status: DC | PRN

## 2011-09-03 MED ORDER — DOCUSATE SODIUM 100 MG PO CAPS
100.0000 mg | ORAL_CAPSULE | Freq: Every day | ORAL | Status: DC
  Administered 2011-09-04 – 2011-09-05 (×2): 100 mg via ORAL
  Filled 2011-09-03 (×2): qty 1

## 2011-09-03 MED ORDER — BETAMETHASONE SOD PHOS & ACET 6 (3-3) MG/ML IJ SUSP
12.0000 mg | Freq: Once | INTRAMUSCULAR | Status: AC
  Administered 2011-09-04: 12 mg via INTRAMUSCULAR
  Filled 2011-09-03: qty 2

## 2011-09-03 MED ORDER — BETAMETHASONE SOD PHOS & ACET 6 (3-3) MG/ML IJ SUSP: 12.0000 mg | Freq: Once | INTRAMUSCULAR | Status: DC

## 2011-09-03 MED ORDER — LACTATED RINGERS IV SOLN
INTRAVENOUS | Status: DC
  Administered 2011-09-03 – 2011-09-05 (×5): via INTRAVENOUS

## 2011-09-03 MED ORDER — CEFAZOLIN SODIUM 1-5 GM-% IV SOLN
1.0000 g | Freq: Three times a day (TID) | INTRAVENOUS | Status: DC
  Administered 2011-09-03 – 2011-09-06 (×8): 1 g via INTRAVENOUS
  Filled 2011-09-03 (×9): qty 50

## 2011-09-03 MED ORDER — PRENATAL MULTIVITAMIN CH
1.0000 | ORAL_TABLET | Freq: Every day | ORAL | Status: DC
  Administered 2011-09-04 – 2011-09-05 (×2): 1 via ORAL
  Filled 2011-09-03 (×2): qty 1

## 2011-09-03 MED ORDER — CALCIUM CARBONATE ANTACID 500 MG PO CHEW: 2.0000 | CHEWABLE_TABLET | ORAL | Status: DC | PRN

## 2011-09-03 MED ORDER — ZOLPIDEM TARTRATE 10 MG PO TABS: 10.0000 mg | ORAL_TABLET | Freq: Every evening | ORAL | Status: DC | PRN

## 2011-09-03 MED ORDER — BETAMETHASONE SOD PHOS & ACET 6 (3-3) MG/ML IJ SUSP
12.0000 mg | Freq: Once | INTRAMUSCULAR | Status: AC
  Administered 2011-09-03: 12 mg via INTRAMUSCULAR
  Filled 2011-09-03: qty 2

## 2011-09-03 MED ORDER — MAGNESIUM SULFATE BOLUS VIA INFUSION
4.0000 g | Freq: Once | INTRAVENOUS | Status: AC
  Administered 2011-09-03: 4 g via INTRAVENOUS
  Filled 2011-09-03: qty 500

## 2011-09-03 NOTE — Progress Notes (Signed)
Patient ID: Molly Guerrero, female   DOB: 06/27/1971, 41 y.o.   MRN: [id number] History Per Language Line interpreter, not feeling any UCs or cramps. Good FM. Unaware of any bleeding. No LOF. Denies dysuria, frequency, urgency, gross hematuria. States she has not taken any antibiotics during pregnancy. [facility] note documents she took Keflex for E. Coli UTI in Sept. On 05/18/12 they prescribed Macrobid for [facility] UTI when she had slight dysuria. She filled that Rx yesterday but has not taken it.   Physical exam Filed Vitals:   09/03/11 1729  BP: 106/54  Pulse: 79  Temp: 97.6 F (36.4 C)  Resp: 18  Blood pressure 106/54, pulse 79, temperature 97.6 F (36.4 C), temperature source Oral, resp. rate 18, height 5' (1.524 m), last menstrual period 03/31/2011, SpO2 99.00%. GENERAL: Well-developed, well-nourished female in no acute distress.  ABDOMEN: Soft, nontender Back: neg CVAT VE: soft ext loose 1, int ftp to closed/long/OOP, scant brown mucus Toco: runs of UCs q 2-3 min FHR: 150 baseline, moderate variability, 10 bpm accels, no decels  Assessment 41 yo G3P1011 at [redacted]w[redacted]d Threatened PTL Untreated E Coli ASB/UTI  Plan C/W Dr. Shawnie Pons Urine C&S; Start Ancef Magnesium x 12 hr BMZ

## 2011-09-03 NOTE — Progress Notes (Signed)
[employer], [employer],  Equities trader number [phone]

## 2011-09-03 NOTE — ED Provider Notes (Signed)
Chart reviewed and agree with management and plan.  

## 2011-09-03 NOTE — Progress Notes (Signed)
Pt received to rm [id number], admission information obtained with [facility] Interpreter # [phone]

## 2011-09-03 NOTE — H&P (Signed)
Molly Guerrero is a 41 y.o. female Pt is originally from [place] and the visit was conducted using over the phone-interpreting services. Pt 40 y/o P G3P1011 (1 trimester miscarriage) with 27.3 weeks per U/S that comes today to [name] for bloody show while wiping in the morning. This was not accompanied by pain or contractions, no vaginal discharge or fluid per vagina. No headaches, epigastric pain, edema or visual disturbances. No other complaints. U/S negative for placenta previa. Last sexual intercourse more than a month ago. She has had  E coli UTI treated with Cefalexin(10/15012) and Macrobid(06/10/11). She receives care at [facility].  History OB History    Grav Para Term Preterm Abortions TAB SAB Ect Mult Living   3 1 1  0 1 0 1 0 0 1     Past Medical History  Diagnosis Date  . No pertinent past medical history    Past Surgical History  Procedure Date  . No past surgeries    Family History: family history is not on file. Social History:  reports that she has never smoked. She does not have any smokeless tobacco history on file. She reports that she does not drink alcohol or use illicit drugs.  ROS Constitutional: Negative.  HENT: Negative.  Respiratory: Negative.  Cardiovascular: Negative.  Gastrointestinal: Negative.  Genitourinary: Negative.  Musculoskeletal: Negative.  Skin: Negative.  Neurological: Negative    Blood pressure 110/65, pulse 72, temperature 97.9 F (36.6 C), temperature source Oral, resp. rate 18, height 5' (1.524 m), last menstrual period 03/31/2011, SpO2 99.00%. Exam Physical Exam  Constitutional: She is oriented to person, place, and time. No distress.  HENT:  Mouth/Throat: Oropharynx is clear and moist.  Eyes: Conjunctivae are normal.  Neck: Neck supple.  Cardiovascular: Normal rate, regular rhythm and normal heart sounds.  No murmur heard.  Respiratory: Effort normal and breath sounds normal.  GI: Bowel sounds are normal.  Genitourinary:  Speculum:  minimal mucous-bloody tinted vaginal discharge. Bimanual: Closed cervix. 50 % effacement.  Musculoskeletal: Normal range of motion. She exhibits no edema.  Neurological: She is alert and oriented to person, place, and time. She has normal reflexes.   Prenatal labs: ABO, Rh:  O positive Antibody:  neg Rubella:  immune/ varicella immune RPR:   neg HBsAg:   neg HIV:   neg GBS:   sample taken today 1h GGT: 98 Assessment/Plan:  Pt 40 y/o P G3P1011 with 27.3 weeks presenting with vaginal bleeding in third trimester. Without contractions, closed cervix and U/S wnl (breech presentation). Plan Will admit for observation 23h. Toco continuously and NST per shift for 30  Min.   D. [place] Sherron Flemings Paz. MD PGY-1 09/03/2011, 1:40 PM

## 2011-09-03 NOTE — H&P (Signed)
Chart reviewed and agree with management and plan.  

## 2011-09-03 NOTE — Progress Notes (Signed)
Pt presents to [name] with chief complaint of vagina bleeding. Pt is [redacted]w[redacted]d, G3P1. Does not speak english; pt speaks [place] and requires assistance with language. Pt states she went to the bathroom this morning and noticed blood in the toilet.

## 2011-09-03 NOTE — ED Provider Notes (Signed)
History     Chief Complaint  Patient presents with  . Vaginal Bleeding    started this AM- Bright Red   HPI Pt is originally from [place] and the visit was conducted using over the phone-interpreting services. Pt 40 y/o P G3P1011 (1 trimester miscarriage) with 27.3 weeks per U/S that comes today to [name] for bloody show while wiping in the morning. This was not accompanied by pain or contractions, no vaginal discharge or fluid per vagina. No headaches, epigastric pain, edema or visual disturbances. No other complaints. U/S negative for placenta previa. Last sexual intercourse more than a month ago. In this pregnancy she has had UTI (E.coli) twice. She receives care at [facility].  OB History    Grav Para Term Preterm Abortions TAB SAB Ect Mult Living   3 1 1  0 1 0 1 0 0 1      Past Medical History  Diagnosis Date  . No pertinent past medical history     Past Surgical History  Procedure Date  . No past surgeries     No family history on file.  History  Substance Use Topics  . Smoking status: Never Smoker   . Smokeless tobacco: Not on file  . Alcohol Use: No    Allergies: No Known Allergies  No prescriptions prior to admission    Review of Systems  Constitutional: Negative.   HENT: Negative.   Respiratory: Negative.   Cardiovascular: Negative.   Gastrointestinal: Negative.   Genitourinary: Negative.   Musculoskeletal: Negative.   Skin: Negative.   Neurological: Negative.    Physical Exam   Blood pressure 110/65, pulse 72, temperature 97.9 F (36.6 C), temperature source Oral, resp. rate 18, last menstrual period 03/31/2011, SpO2 99.00%.  Physical Exam  Constitutional: She is oriented to person, place, and time. No distress.  HENT:  Mouth/Throat: Oropharynx is clear and moist.  Eyes: Conjunctivae are normal.  Neck: Neck supple.  Cardiovascular: Normal rate, regular rhythm and normal heart sounds.   No murmur heard. Respiratory: Effort normal and breath sounds  normal.  GI: Bowel sounds are normal.  Genitourinary:       Speculum: minimal mucous-bloody tinted vaginal discharge. Bimanual: Closed cervix. 50 % effacement.   Musculoskeletal: Normal range of motion. She exhibits no edema.  Neurological: She is alert and oriented to person, place, and time. She has normal reflexes.    [name] Course  Procedures  MDM FHT category 1. No contraction in monitor.  Transvaginal U/S: Cervix is 3.5. Breach presentation. Placenta posterior and above OS. Rest WNL.   Assessment and Plan  Pt 40 y/o P G3P1011 with 27.3 weeks that presents for vaginal bleeding. Closed cervix and normal u/s(with breech presentation). Pt was consulted with attending Dr. Shawnie Pons who recommended 23 h observation to evaluate recurrence of bleeding and/or cervical changes.  D. [place] Sherron Flemings Paz. MD PGY-1 09/03/2011, 12:07 PM   D/W Dr. Shawnie Pons. Closes obs and consider for BMZ, Magnesium if signs of labor  Molly Guerrero 09/03/2011

## 2011-09-04 DIAGNOSIS — O4700 False labor before 37 completed weeks of gestation, unspecified trimester: Secondary | ICD-10-CM | POA: Diagnosis present

## 2011-09-04 NOTE — Progress Notes (Signed)
Patient ID: Molly Guerrero, female   DOB: Apr 21, 1971, 41 y.o.   MRN: [id number] FACULTY PRACTICE ANTEPARTUM(COMPREHENSIVE) NOTE  Molly Guerrero is a 41 y.o. G3P1011 at [redacted]w[redacted]d  who is admitted for Preterm labor.   Fetal presentation is breech. Length of Stay:  1  Days  Subjective: Is not feeling ctx's.  Denies significant bleeding. Patient reports good fetal movement.  She reports no uterine contractions, no bleeding and no loss of fluid per vagina.  Vitals:  Blood pressure 101/60, pulse 75, temperature 98.3 F (36.8 C), temperature source Oral, resp. rate 16, height 5' (1.524 m), weight 58.627 kg (129 lb 4 oz), last menstrual period 03/31/2011, SpO2 99.00%. Physical Examination:  General appearance - alert, well appearing, and in no distress Abdomen - soft, nontender, nondistended, no masses or organomegaly Fundal Height:  size equals dates Extremities: extremities normal, atraumatic, no cyanosis or edema  Membranes:intact  Fetal Monitoring:  Baseline: 150 bpm, Variability: Good {> 6 bpm), Accelerations: Non-reactive but appropriate for gestational age and Decelerations: Absent  Labs:  Recent Results (from the past 24 hour(s))  URINALYSIS, ROUTINE W REFLEX MICROSCOPIC   Collection Time   09/03/11 11:45 AM      Component Value Range   Color, Urine YELLOW  YELLOW    APPearance CLEAR  CLEAR    Specific Gravity, Urine <1.005 (*) 1.005 - 1.030    pH 6.0  5.0 - 8.0    Glucose, UA NEGATIVE  NEGATIVE (mg/dL)   Hgb urine dipstick MODERATE (*) NEGATIVE    Bilirubin Urine NEGATIVE  NEGATIVE    Ketones, ur NEGATIVE  NEGATIVE (mg/dL)   Protein, ur NEGATIVE  NEGATIVE (mg/dL)   Urobilinogen, UA 0.2  0.0 - 1.0 (mg/dL)   Nitrite NEGATIVE  NEGATIVE    Leukocytes, UA SMALL (*) NEGATIVE   URINE MICROSCOPIC-ADD ON   Collection Time   09/03/11 11:45 AM      Component Value Range   Squamous Epithelial / LPF FEW (*) RARE    WBC, UA 3-6  <3 (WBC/hpf)   RBC / HPF 3-6  <3 (RBC/hpf)   Bacteria, UA FEW (*) RARE      Imaging Studies:    Breech, cervix 3.5 cm, no placental issues    Medications:  Scheduled    . betamethasone acetate-betamethasone sodium phosphate  12 mg Intramuscular Once   Followed by  . betamethasone acetate-betamethasone sodium phosphate  12 mg Intramuscular Once  .  ceFAZolin (ANCEF) IV  1 g Intravenous Q8H  . docusate sodium  100 mg Oral Daily  . magnesium  4 g Intravenous Once  . prenatal multivitamin  1 tablet Oral Daily  . DISCONTD: betamethasone acetate-betamethasone sodium phosphate  12 mg Intramuscular Once  . DISCONTD: betamethasone acetate-betamethasone sodium phosphate  12 mg Intramuscular Once   I have reviewed the patient's current medications.  ASSESSMENT: Patient Active Problem List  Diagnoses  . OTHER THALASSEMIA  . GERD  . CONSTIPATION  . HEMATURIA UNSPECIFIED  . CBC, ABNORMAL  . Threatened preterm labor    PLAN: Complete bmz, continue magnesium, await urine and gbs culture, continue abx. Continue routine antenatal care.   PRATT,TANYA S 09/04/2011,7:24 AM

## 2011-09-04 NOTE — Progress Notes (Addendum)
INTERPRETER [phone] OF [facility] CALLED AT 1055 TO SPEAK TO PT TO SEE IF PT HAS QUESTIONS. PT REPORTS NO BLEEDING SINCE 2/2 0900 AND NO PAIN OR UC'S ONLY FEELS BABY MOVING. DISCUSSED PLAN FOR BMZ TONIGHT. MENU FOR TODAY PLANNED.  DISCUSSED HOW TO GET UP TO BR.

## 2011-09-05 DIAGNOSIS — A498 Other bacterial infections of unspecified site: Secondary | ICD-10-CM

## 2011-09-05 DIAGNOSIS — O4700 False labor before 37 completed weeks of gestation, unspecified trimester: Secondary | ICD-10-CM

## 2011-09-05 DIAGNOSIS — N390 Urinary tract infection, site not specified: Secondary | ICD-10-CM

## 2011-09-05 DIAGNOSIS — O2390 Unspecified genitourinary tract infection in pregnancy, unspecified trimester: Secondary | ICD-10-CM

## 2011-09-05 NOTE — Progress Notes (Signed)
UR chart review completed.  

## 2011-09-05 NOTE — Plan of Care (Signed)
Problem: Consults Goal: Birthing Suites Patient Information Press F2 to bring up selections list   Pt < [redacted] weeks EGA     

## 2011-09-05 NOTE — Progress Notes (Signed)
Patient ID: Molly Guerrero, female   DOB: 10-Mar-1971, 41 y.o.   MRN:   FACULTY PRACTICE ANTEPARTUM(COMPREHENSIVE) NOTE  Molly Guerrero is a 40 y.o. G3P1011 at [redacted]w[redacted]d  who is admitted for Preterm labor.   Length of Stay:  2  Days  Subjective: Denies complaints Patient reports the fetal movement as active. Patient reports uterine contraction  activity as none. Patient reports  vaginal bleeding as none. Patient describes fluid per vagina as None.  Vitals:  Blood pressure 107/50, pulse 78, temperature 98.5 F (36.9 C), temperature source Oral, resp. rate 18, height 5' (1.524 m), weight 58.627 kg (129 lb 4 oz), last menstrual period 03/31/2011, SpO2 99.00%. Physical Examination:  General appearance - alert, well appearing, and in no distress, oriented to person, place, and time and well hydrated Abdominal Exam:   Pelvic Exam:  normal external genitalia, vulva, vagina, cervix, uterus and adnexa, VULVA: normal appearing vulva with no masses, tenderness or lesions Cervical Exam: SVE done and found to be <0.5 cm/ 50%/Floating and fetal presentation is unsure. Extremities: extremities normal, atraumatic, no cyanosis or edema, Homans sign is negative, no sign of DVT, no edema, redness or tenderness in the calves or thighs and no ulcers, gangrene or trophic changes with DTRs 2+ bilaterally Membranes:intact  Fetal Monitoring:  fhr pattern reassuring  Labs:  No results found for this or any previous visit (from the past 24 hour(s)).  Imaging Studies:    n/a  Medications:  Scheduled    . betamethasone acetate-betamethasone sodium phosphate  12 mg Intramuscular Once  .  ceFAZolin (ANCEF) IV  1 g Intravenous Q8H  . docusate sodium  100 mg Oral Daily  . prenatal multivitamin  1 tablet Oral Daily   I have reviewed the patient's current medications.  ASSESSMENT: Patient Active Problem List  Diagnoses  . OTHER THALASSEMIA  . GERD  . CONSTIPATION  . HEMATURIA UNSPECIFIED  . CBC, ABNORMAL  . Threatened  preterm labor    PLAN: Stop MGSO4, watch closely.  Zerita Boers 09/05/2011,7:19 AM

## 2011-09-06 LAB — URINE CULTURE

## 2011-09-06 LAB — CULTURE, BETA STREP (GROUP B ONLY)

## 2011-09-06 MED ORDER — CEPHALEXIN 500 MG PO CAPS: 500.0000 mg | ORAL_CAPSULE | Freq: Four times a day (QID) | ORAL | Status: AC

## 2011-09-06 NOTE — Discharge Summary (Signed)
Antenatal Physician Discharge Summary  Patient ID: Molly Guerrero MRN: [id number] DOB/AGE: 08-23-1970 41 y.o.  Admit date: 09/03/2011 Discharge date: 09/06/2011  Admission Diagnoses: Threatened preterm labor  Discharge Diagnoses: The same  Prenatal Procedures: NST and ultrasound  Consults: None  Significant Diagnostic Studies: Urine culture showed E.coli UTI  Treatments: IV hydration, antibiotics: Ancef, steroids: betamethasone and magnesium sulfate  Hospital Course:  This is a Molly Guerrero 41 y.o. [phone] with IUP at [redacted]w[redacted]d admitted for threatened preterm labor. On admission, her cervix was FT/long/high but she was noted to have scant blood with mucus consistent with bloody show.  She had an ultrasound that showed a normal posterior placenta, cervical length was 3.5 cm, breech fetal presentation.  She was noted to have contractions, so she was admitted for further management.  She was given magnesium sulfate for 12 hours and betamethasone for two doses. Her labs showed she had an E.coli UTI and she was appropriately treated.  On day of discharge, she denied any symptoms, there was no change in her cervical exam and she was deemed stable for discharge to home.  Discharged Condition: stable  Discharge Exam: Blood pressure 94/51, pulse 89, temperature 97.9 F (36.6 C), temperature source Oral, resp. rate 20, height 5' (1.524 m), weight 58.627 kg (129 lb 4 oz), last menstrual period 03/31/2011, SpO2 99.00%. General appearance: alert and no distress GI: soft, non-tender; bowel sounds normal; no masses,  no organomegaly Pelvic: Cervix was FT/Long/high Extremities: extremities normal, atraumatic, no cyanosis or edema and Homans sign is negative, no sign of DVT  Disposition: Home or Self Care   Medication List  As of 09/06/2011 10:03 AM   TAKE these medications         cephALEXin 500 MG capsule   Commonly known as: KEFLEX   Take 1 capsule (500 mg total) by mouth 4 (four) times daily.            Follow-up Information    Follow up with [facility] [place] on 09/15/2011. (10:30am. Come to [name]  if symptoms worsen or  as needed)    Contact information:   [address] [facility] [address]          SignedJaynie Collins A 09/06/2011, 10:03 AM

## 2011-09-06 NOTE — Progress Notes (Signed)
Patient ID: Molly Guerrero, female   DOB: [birth year], 41 y.o.   MRN:   FACULTY PRACTICE ANTEPARTUM(COMPREHENSIVE) NOTE  Molly Guerrero is a 41 y.o. G3P1011 at [redacted]w[redacted]d  who is admitted for Preterm labor.   Length of Stay:  3  Days  Subjective: Denies complaints Patient reports the fetal movement as active. Patient reports uterine contraction  activity as none. Patient reports  vaginal bleeding as none. Patient describes fluid per vagina as None.  Vitals:  Blood pressure 94/51, pulse 89, temperature 97.9 F (36.6 C), temperature source Oral, resp. rate 20, height 5' (1.524 m), weight 58.627 kg (129 lb 4 oz), last menstrual period 03/31/2011, SpO2 99.00%. Physical Examination:  General appearance - alert, well appearing, and in no distress, oriented to person, place, and time and well hydrated Abdominal Exam:   Pelvic Exam:  normal external genitalia, vulva, vagina, cervix, uterus and adnexa, VULVA: normal appearing vulva with no masses, tenderness or lesions Cervical Exam: SVE done and found to be <0.5 cm/ long/Floating and fetal presentation is unsure. Extremities: extremities normal, atraumatic, no cyanosis or edema, Homans sign is negative, no sign of DVT, no edema, redness or tenderness in the calves or thighs and no ulcers, gangrene or trophic changes with DTRs 2+ bilaterally Membranes:intact  Fetal Monitoring:  fhr pattern reassuring, reactive in the 140s.  Tocometer: No contractions  Labs:  No results found for this or any previous visit (from the past 24 hour(s)).  Imaging Studies:    n/a  Medications:  Scheduled    .  ceFAZolin (ANCEF) IV  1 g Intravenous Q8H  . docusate sodium  100 mg Oral Daily  . prenatal multivitamin  1 tablet Oral Daily   I have reviewed the patient's current medications.  ASSESSMENT: Patient Active Problem List  Diagnoses  . OTHER THALASSEMIA  . GERD  . CONSTIPATION  . HEMATURIA UNSPECIFIED  . CBC, ABNORMAL  . Threatened preterm labor    PLAN: No  progressing PTL Will discharge to home with precautions  Esgar Barnick A 09/06/2011,9:43 AM

## 2011-11-03 LAB — OB RESULTS CONSOLE GBS: GBS: NEGATIVE

## 2011-12-01 ENCOUNTER — Encounter (HOSPITAL_COMMUNITY): Payer: Self-pay | Admitting: *Deleted

## 2011-12-01 ENCOUNTER — Inpatient Hospital Stay (HOSPITAL_COMMUNITY): Payer: Medicaid Other

## 2011-12-01 ENCOUNTER — Inpatient Hospital Stay (HOSPITAL_COMMUNITY)
Admission: AD | Admit: 2011-12-01 | Discharge: 2011-12-03 | DRG: 774 | Disposition: A | Discharge status: Home / Self Care | Payer: Medicaid Other | Source: Ambulatory Visit | Attending: Obstetrics and Gynecology | Admitting: Obstetrics and Gynecology

## 2011-12-01 DIAGNOSIS — O4593 Premature separation of placenta, unspecified, third trimester: Secondary | ICD-10-CM | POA: Diagnosis present

## 2011-12-01 DIAGNOSIS — R799 Abnormal finding of blood chemistry, unspecified: Secondary | ICD-10-CM

## 2011-12-01 DIAGNOSIS — O09529 Supervision of elderly multigravida, unspecified trimester: Secondary | ICD-10-CM | POA: Diagnosis present

## 2011-12-01 DIAGNOSIS — O4690 Antepartum hemorrhage, unspecified, unspecified trimester: Secondary | ICD-10-CM

## 2011-12-01 DIAGNOSIS — O365990 Maternal care for other known or suspected poor fetal growth, unspecified trimester, not applicable or unspecified: Secondary | ICD-10-CM | POA: Diagnosis present

## 2011-12-01 DIAGNOSIS — O4693 Antepartum hemorrhage, unspecified, third trimester: Secondary | ICD-10-CM

## 2011-12-01 HISTORY — DX: Unspecified infection of urinary tract in pregnancy, unspecified trimester: O23.40

## 2011-12-01 LAB — CBC
Platelets: 234 10*3/uL (ref 150–400)
RDW: 15.1 % (ref 11.5–15.5)
WBC: 7.6 10*3/uL (ref 4.0–10.5)

## 2011-12-01 MED ORDER — LACTATED RINGERS IV SOLN
INTRAVENOUS | Status: DC
  Administered 2011-12-01 (×2): via INTRAVENOUS

## 2011-12-01 MED ORDER — OXYCODONE-ACETAMINOPHEN 5-325 MG PO TABS: 1.0000 | ORAL_TABLET | ORAL | Status: DC | PRN

## 2011-12-01 MED ORDER — OXYTOCIN BOLUS FROM INFUSION
500.0000 mL | Freq: Once | INTRAVENOUS | Status: DC
  Filled 2011-12-01: qty 1000
  Filled 2011-12-01: qty 500

## 2011-12-01 MED ORDER — OXYTOCIN 20 UNITS IN LACTATED RINGERS INFUSION - SIMPLE
125.0000 mL/h | Freq: Once | INTRAVENOUS | Status: AC
  Administered 2011-12-01: 125 mL/h via INTRAVENOUS

## 2011-12-01 MED ORDER — FLEET ENEMA 7-19 GM/118ML RE ENEM: 1.0000 | ENEMA | RECTAL | Status: DC | PRN

## 2011-12-01 MED ORDER — CITRIC ACID-SODIUM CITRATE 334-500 MG/5ML PO SOLN: 30.0000 mL | ORAL | Status: DC | PRN

## 2011-12-01 MED ORDER — NALBUPHINE SYRINGE 5 MG/0.5 ML
5.0000 mg | INJECTION | INTRAMUSCULAR | Status: DC | PRN
  Administered 2011-12-01 (×2): 5 mg via INTRAVENOUS
  Filled 2011-12-01 (×2): qty 0.5

## 2011-12-01 MED ORDER — LACTATED RINGERS IV SOLN
500.0000 mL | INTRAVENOUS | Status: DC | PRN
  Administered 2011-12-01: 500 mL via INTRAVENOUS

## 2011-12-01 MED ORDER — MISOPROSTOL 200 MCG PO TABS
ORAL_TABLET | ORAL | Status: AC
  Administered 2011-12-01: 800 ug
  Filled 2011-12-01: qty 4

## 2011-12-01 MED ORDER — LIDOCAINE HCL (PF) 1 % IJ SOLN
30.0000 mL | INTRAMUSCULAR | Status: DC | PRN
  Filled 2011-12-01: qty 30

## 2011-12-01 MED ORDER — ONDANSETRON HCL 4 MG/2ML IJ SOLN: 4.0000 mg | Freq: Four times a day (QID) | INTRAMUSCULAR | Status: DC | PRN

## 2011-12-01 MED ORDER — IBUPROFEN 600 MG PO TABS: 600.0000 mg | ORAL_TABLET | Freq: Four times a day (QID) | ORAL | Status: DC | PRN

## 2011-12-01 MED ORDER — ACETAMINOPHEN 325 MG PO TABS: 650.0000 mg | ORAL_TABLET | ORAL | Status: DC | PRN

## 2011-12-01 NOTE — Progress Notes (Signed)
Molly Guerrero is a 41 y.o. G3P1011 at [redacted]w[redacted]d.  Subjective: Uncomfortable, but coping well.  Objective: BP 126/73  Pulse 81  Temp(Src) 98.2 F (36.8 C) (Oral)  Resp 20  Ht 5' (1.524 m)  Wt 63.504 kg (140 lb)  BMI 27.34 kg/m2  SpO2 100%  LMP 03/31/2011      FHT:  FHR: 140  bpm, variability: moderate,  accelerations:  Present,  decelerations:  Absent UC:   regular, every 2-3 minutes, moderate  SVE:   Dilation: 5 Effacement (%): Thick Station: -1 Exam by:: Ivonne Andrew, CNM Small amount of frank BRB  Labs: Lab Results  Component Value Date   WBC 7.6 12/01/2011   HGB 11.5* 12/01/2011   HCT 35.3* 12/01/2011   MCV 68.9* 12/01/2011   PLT 234 12/01/2011    Assessment / Plan: Spontaneous labor, progressing normally  Labor: Progressing normally Preeclampsia:  NA Fetal Wellbeing:  Category I Pain Control:  Labor support without medications I/D:  n/a Anticipated MOD:  NSVD  Miquan Tandon 12/01/2011, 5:45 PM

## 2011-12-01 NOTE — H&P (Signed)
Agree with above note.  Stepfon Rawles 12/01/2011 1:41 PM

## 2011-12-01 NOTE — MAU Provider Note (Signed)
Molly Guerrero is a 41 y.o. VN female patient @ [redacted]w[redacted]d gestation who presents to [name] for abdominal pain and vaginal bleeding. She is getting her prenatal care at [facility]. She is going for weekly visits. She reports that this morning she had a episode of vaginal bleeding with bright red blood. The bleeding stopped but then started again so she came in. Prior to the bleeding she had watery mucous discharge. She has had frequent UTI's with this pregnancy and is on a daily Macrobid 100 mg. The history was provided by the patient using the [employer].   Past Medical History  Diagnosis Date  . No pertinent past medical history   . UTI (urinary tract infection) during pregnancy    OB History    Grav Para Term Preterm Abortions TAB SAB Ect Mult Living   3 1 1  0 1 0 1 0 0 1     [redacted]w[redacted]d Estimated Date of Delivery: 11/30/11  No Known Allergies Active Problems:  * No active hospital problems. *   Blood pressure 118/75, pulse 97, temperature 98.2 F (36.8 C), temperature source Oral, resp. rate 20, last menstrual period 03/31/2011, SpO2 100.00%.  Maternal Medical History:  Reason for admission: Reason for admission: vaginal bleeding.  Contractions: Frequency: regular.   Perceived severity is moderate.    Fetal activity: Perceived fetal activity is normal.     : As above Maternal Exam:  Uterine Assessment: Contraction duration is 3 minutes. Contraction frequency is regular.   Abdomen: Patient reports no abdominal tenderness. Fetal presentation: vertex  Pelvis: adequate for delivery.     : EFM base line 150, reactive tracing, contractions every 3 to 4 minutes, Small amount of bleeding, Cervix 1.5 cm dilated, thick.    Care turned over to [facility] [place] @ 11:15 am  NEESE,HOPE 12/01/2011  US Ob Limited: normal placenta, no evidence of abruption, AFV normal  Assessment: Early latent labor.  Membrane status: intact.  Fetal well-being: normal.  Vaginal bleeding at term  - suspected abruption   Plan: Admit to L&D - pt contracting regularly, reports increased pain with contractions at this time, will observe for cervical change. If no labor, will start pitocin : 41 y/o VN female with vaginal bleeding @ 40.0 wks. Gestation.  Dorathy Kinsman, CNM will see patient in [name] to do bedside u/s for presentation. (vertex)  Will order ultrasound to check fluid level and assess placenta.

## 2011-12-01 NOTE — Progress Notes (Signed)
Molly Guerrero is a 41 y.o. G3P1011 at [redacted]w[redacted]d.  Subjective: Good pain relief w/ Nubain.  Objective: BP 124/70  Pulse 73  Temp(Src) 98.7 F (37.1 C) (Oral)  Resp 20  Ht 5' (1.524 m)  Wt 63.504 kg (140 lb)  BMI 27.34 kg/m2  SpO2 100%  LMP 03/31/2011      FHT:  FHR: 150 bpm, variability: moderate,  accelerations:  Present,  decelerations:  Absent UC:   regular, every 2-3 minutes, moderate SVE:   Dilation: 4 Effacement (%): Thick Station: -3 Exam by:: V.Teodoro Jeffreys, CNM  Labs: Lab Results  Component Value Date   WBC 7.6 12/01/2011   HGB 11.5* 12/01/2011   HCT 35.3* 12/01/2011   MCV 68.9* 12/01/2011   PLT 234 12/01/2011    Assessment / Plan: Spontaneous labor, progressing normally  Labor: Progressing normally Preeclampsia:  NA Fetal Wellbeing:  Category I Pain Control:  Nubain I/D:  n/a Anticipated MOD:  NSVD  Josy Peaden 12/01/2011, 3:03 PM

## 2011-12-01 NOTE — H&P (Signed)
See [name] note. 

## 2011-12-01 NOTE — MAU Note (Signed)
Pt states vaginal bleeding started this am at 0600 and is bright red.  Peripad with small amount of blood on it.  Pt denies ROM.  Info. Obtained from [place]. Line.

## 2011-12-01 NOTE — MAU Note (Signed)
Bleeding started this morning, is having some lower abd pain.

## 2011-12-01 NOTE — Progress Notes (Signed)
Molly Guerrero is a 41 y.o. G3P1011 at [redacted]w[redacted]d.  Subjective: Coping well w/ UC's  Objective: BP 115/65  Pulse 78  Temp(Src) 97.9 F (36.6 C) (Oral)  Resp 18  Ht 5' (1.524 m)  Wt 63.504 kg (140 lb)  BMI 27.34 kg/m2  SpO2 100%  LMP 03/31/2011      FHT:  FHR: 145 bpm, variability: moderate,  accelerations:  Present,  decelerations:  Absent UC:   regular, every 2-3 minutes, moderate SVE:   Dilation: 5 Effacement (%): 80 Station: -1 Exam by:: v. Kye Silverstein, cnm AROM'd small amount of ? blood tinged fluid   Labs: Lab Results  Component Value Date   WBC 7.6 12/01/2011   HGB 11.5* 12/01/2011   HCT 35.3* 12/01/2011   MCV 68.9* 12/01/2011   PLT 234 12/01/2011    Assessment / Plan: Spontaneous labor, progressing normally  Labor: Progressing normally Preeclampsia:  NA Fetal Wellbeing:  Category I Pain Control:  Labor support without medications I/D:  n/a Anticipated MOD:  NSVD  Molly Guerrero 12/01/2011, 7:56 PM

## 2011-12-02 ENCOUNTER — Encounter (HOSPITAL_COMMUNITY): Payer: Self-pay | Admitting: Advanced Practice Midwife

## 2011-12-02 LAB — CBC
HCT: 29.5 % — ABNORMAL LOW (ref 36.0–46.0)
Hemoglobin: 9.6 g/dL — ABNORMAL LOW (ref 12.0–15.0)
RDW: 14.8 % (ref 11.5–15.5)
WBC: 16.5 10*3/uL — ABNORMAL HIGH (ref 4.0–10.5)

## 2011-12-02 MED ORDER — METHYLERGONOVINE MALEATE 0.2 MG/ML IJ SOLN: 0.2000 mg | INTRAMUSCULAR | Status: DC | PRN

## 2011-12-02 MED ORDER — METHYLERGONOVINE MALEATE 0.2 MG PO TABS: 0.2000 mg | ORAL_TABLET | ORAL | Status: DC | PRN

## 2011-12-02 MED ORDER — TETANUS-DIPHTH-ACELL PERTUSSIS 5-2.5-18.5 LF-MCG/0.5 IM SUSP
0.5000 mL | Freq: Once | INTRAMUSCULAR | Status: AC
  Administered 2011-12-02: 0.5 mL via INTRAMUSCULAR
  Filled 2011-12-02: qty 0.5

## 2011-12-02 MED ORDER — DIPHENHYDRAMINE HCL 25 MG PO CAPS: 25.0000 mg | ORAL_CAPSULE | Freq: Four times a day (QID) | ORAL | Status: DC | PRN

## 2011-12-02 MED ORDER — PRENATAL MULTIVITAMIN CH
1.0000 | ORAL_TABLET | Freq: Every day | ORAL | Status: DC
  Administered 2011-12-03: 1 via ORAL
  Filled 2011-12-02: qty 1

## 2011-12-02 MED ORDER — FERROUS SULFATE 325 (65 FE) MG PO TABS
325.0000 mg | ORAL_TABLET | Freq: Two times a day (BID) | ORAL | Status: DC
  Administered 2011-12-02 – 2011-12-03 (×3): 325 mg via ORAL
  Filled 2011-12-02 (×3): qty 1

## 2011-12-02 MED ORDER — WITCH HAZEL-GLYCERIN EX PADS: 1.0000 "application " | MEDICATED_PAD | CUTANEOUS | Status: DC | PRN

## 2011-12-02 MED ORDER — SENNOSIDES-DOCUSATE SODIUM 8.6-50 MG PO TABS
2.0000 | ORAL_TABLET | Freq: Every day | ORAL | Status: DC
  Administered 2011-12-02: 2 via ORAL

## 2011-12-02 MED ORDER — ZOLPIDEM TARTRATE 5 MG PO TABS: 5.0000 mg | ORAL_TABLET | Freq: Every evening | ORAL | Status: DC | PRN

## 2011-12-02 MED ORDER — IBUPROFEN 600 MG PO TABS
600.0000 mg | ORAL_TABLET | Freq: Four times a day (QID) | ORAL | Status: DC
  Administered 2011-12-02 – 2011-12-03 (×7): 600 mg via ORAL
  Filled 2011-12-02 (×7): qty 1

## 2011-12-02 MED ORDER — SIMETHICONE 80 MG PO CHEW: 80.0000 mg | CHEWABLE_TABLET | ORAL | Status: DC | PRN

## 2011-12-02 MED ORDER — DIBUCAINE 1 % RE OINT: 1.0000 "application " | TOPICAL_OINTMENT | RECTAL | Status: DC | PRN

## 2011-12-02 MED ORDER — ONDANSETRON HCL 4 MG/2ML IJ SOLN: 4.0000 mg | INTRAMUSCULAR | Status: DC | PRN

## 2011-12-02 MED ORDER — MEASLES, MUMPS & RUBELLA VAC ~~LOC~~ INJ
0.5000 mL | INJECTION | Freq: Once | SUBCUTANEOUS | Status: DC
  Filled 2011-12-02: qty 0.5

## 2011-12-02 MED ORDER — LANOLIN HYDROUS EX OINT: 1.0000 "application " | TOPICAL_OINTMENT | CUTANEOUS | Status: DC | PRN

## 2011-12-02 MED ORDER — ONDANSETRON HCL 4 MG PO TABS: 4.0000 mg | ORAL_TABLET | ORAL | Status: DC | PRN

## 2011-12-02 MED ORDER — BENZOCAINE-MENTHOL 20-0.5 % EX AERO: 1.0000 "application " | INHALATION_SPRAY | CUTANEOUS | Status: DC | PRN

## 2011-12-02 MED ORDER — MAGNESIUM HYDROXIDE 400 MG/5ML PO SUSP: 30.0000 mL | ORAL | Status: DC | PRN

## 2011-12-02 MED ORDER — OXYCODONE-ACETAMINOPHEN 5-325 MG PO TABS: 1.0000 | ORAL_TABLET | ORAL | Status: DC | PRN

## 2011-12-02 NOTE — Progress Notes (Signed)
Patient seen and examined by me. Agree with above note.  Molly Guerrero 12/02/2011 8:15 AM

## 2011-12-02 NOTE — Progress Notes (Signed)
UR Chart review completed.  

## 2011-12-02 NOTE — Progress Notes (Signed)
Post Partum Day 1 Subjective: no complaints, up ad lib, voiding, tolerating PO, + flatus and No BM  Objective: Blood pressure 105/65, pulse 85, temperature 98.4 F (36.9 C), temperature source Oral, resp. rate 18, height 5' (1.524 m), weight 63.504 kg (140 lb), last menstrual period 03/31/2011, SpO2 98.00%, unknown if currently breastfeeding.  Physical Exam:  General: alert, cooperative and no distress Lochia: appropriate Uterine Fundus: firm DVT Evaluation: No evidence of DVT seen on physical exam.   Basename 12/02/11 0550 12/01/11 1305  HGB 9.6* 11.5*  HCT 29.5* 35.3*    Assessment/Plan: Plan for discharge tomorrow, Breastfeeding and Lactation consult   LOS: 1 day   Molly Guerrero 12/02/2011, 8:12 AM

## 2011-12-03 ENCOUNTER — Encounter (HOSPITAL_COMMUNITY): Payer: Self-pay | Admitting: *Deleted

## 2011-12-03 MED ORDER — METHYLERGONOVINE MALEATE 0.2 MG PO TABS: 0.2000 mg | ORAL_TABLET | Freq: Three times a day (TID) | ORAL | Status: DC

## 2011-12-03 MED ORDER — IBUPROFEN 600 MG PO TABS: 600.0000 mg | ORAL_TABLET | Freq: Four times a day (QID) | ORAL | Status: AC

## 2011-12-03 NOTE — Progress Notes (Signed)
Birth weight changed on delivery summary to reported weight.

## 2011-12-03 NOTE — Discharge Summary (Signed)
Obstetric Discharge Summary Reason for Admission: onset of labor and bleeding in pregnancy. Prenatal Procedures: zNST and ultrasound Intrapartum Procedures: spontaneous vaginal delivery Postpartum Procedures: none Complications-Operative and Postpartum: 2nd degree perineal laceration Hemoglobin  Date Value Range Status  12/02/2011 9.6* 12.0-15.0 (g/dL) Final     HCT  Date Value Range Status  12/02/2011 29.5* 36.0-46.0 (%) Final    Physical Exam:  General: alert, cooperative and appears stated age Lochia: appropriate Uterine Fundus: firm Incision: n/a DVT Evaluation: No evidence of DVT seen on physical exam. Negative Homan's sign.  Discharge Diagnoses: Term Pregnancy-delivered  Discharge Information: Date: 12/03/2011 Activity: pelvic rest Diet: routine Medications: PNV and Ibuprofen Condition: stable Instructions: Reviewed over the phone with language line. Discharge to: home Follow-up Information    Follow up with [facility] in 6 weeks.         Newborn Data: Live born female  Birth Weight: 6 lb 8 oz (2948 g) APGAR: 8, 9  Home with mother.  [facility] 12/03/2011, 11:03 AM

## 2011-12-05 NOTE — Discharge Summary (Signed)
Attestation of Attending Supervision of Advanced Practitioner: Evaluation and management procedures were performed by the [facility] Fellow/PA/CNM/NP under my supervision and collaboration. Chart reviewed, and agree with management and plan.  Jaynie Collins, M.D. 12/05/2011 9:39 AM

## 2011-12-05 NOTE — MAU Provider Note (Signed)
Agree with above note.  Molly Guerrero 12/05/2011 2:08 PM

## 2012-05-08 ENCOUNTER — Inpatient Hospital Stay (HOSPITAL_COMMUNITY)
Admission: AD | Admit: 2012-05-08 | Discharge: 2012-05-08 | Disposition: A | Discharge status: Home / Self Care | Payer: Medicaid Other | Source: Ambulatory Visit | Attending: Obstetrics & Gynecology | Admitting: Obstetrics & Gynecology

## 2012-05-08 ENCOUNTER — Encounter (HOSPITAL_COMMUNITY): Payer: Self-pay

## 2012-05-08 DIAGNOSIS — R102 Pelvic and perineal pain: Secondary | ICD-10-CM

## 2012-05-08 DIAGNOSIS — N949 Unspecified condition associated with female genital organs and menstrual cycle: Secondary | ICD-10-CM | POA: Insufficient documentation

## 2012-05-08 DIAGNOSIS — O99893 Other specified diseases and conditions complicating puerperium: Secondary | ICD-10-CM | POA: Insufficient documentation

## 2012-05-08 DIAGNOSIS — R109 Unspecified abdominal pain: Secondary | ICD-10-CM | POA: Insufficient documentation

## 2012-05-08 LAB — URINALYSIS, ROUTINE W REFLEX MICROSCOPIC
Bilirubin Urine: NEGATIVE
Ketones, ur: NEGATIVE mg/dL
Leukocytes, UA: NEGATIVE
Nitrite: NEGATIVE
Protein, ur: NEGATIVE mg/dL
Urobilinogen, UA: 0.2 mg/dL (ref 0.0–1.0)

## 2012-05-08 LAB — POCT PREGNANCY, URINE: Preg Test, Ur: NEGATIVE

## 2012-05-08 LAB — URINE MICROSCOPIC-ADD ON

## 2012-05-08 MED ORDER — IBUPROFEN 600 MG PO TABS: 600.0000 mg | ORAL_TABLET | Freq: Once | ORAL | Status: DC

## 2012-05-08 NOTE — MAU Note (Signed)
Missed 6 wk PP check up.  Here now.  Is having lower abd pain.

## 2012-05-08 NOTE — MAU Note (Signed)
Pt states via Molly Guerrero dialect interpreter [phone] that she is here for  Lower abd pain. Points to bladder area. Notes vaginal itching and burning. Pt is breastfeeding. Not taking any medications for pain. Points to 2 on pain scale with faces. Intermittent pain, can get worse. Odor to discharge.

## 2012-05-08 NOTE — MAU Provider Note (Signed)
History     CSN: [id number]  Arrival date and time: 05/08/12 1728   None     Chief Complaint  Patient presents with  . check up    HPI Pt is a 41 y.o. [id number] who delivered by SVD 5 months ago and missed her postpartum visit. History is done through phone translator and is difficult to get much detail. She has not had a period since delivery. She is breastfeeding.  She complains of lower pelvic pain and vaginal itching and burning. She denies nausea/vomiting, diarrhea, constipation, fever/chills. She denies dysuria but states that she has been urinating more frequently with small amounts of urine each time.   OB History    Grav Para Term Preterm Abortions TAB SAB Ect Mult Living   5 2 2  0 1 0 1 0 0 2      Past Medical History  Diagnosis Date  . UTI (urinary tract infection) during pregnancy     Past Surgical History  Procedure Date  . No past surgeries     Family History  Problem Relation Age of Onset  . Other Neg Hx     History  Substance Use Topics  . Smoking status: Never Smoker   . Smokeless tobacco: Never Used  . Alcohol Use: No    Allergies: No Known Allergies  Prescriptions prior to admission  Medication Sig Dispense Refill  . Prenatal Vit-Fe Fumarate-FA (PRENATAL MULTIVITAMIN) TABS Take 1 tablet by mouth daily.        Review of Systems  Constitutional: Negative for fever, chills and malaise/fatigue.  Eyes: Negative for blurred vision and double vision.  Respiratory: Negative for shortness of breath.   Cardiovascular: Negative for chest pain.  Gastrointestinal: Positive for abdominal pain. Negative for nausea, vomiting, diarrhea and constipation.  Genitourinary: Positive for frequency. Negative for dysuria and urgency.  Musculoskeletal: Negative for back pain.  Skin: Negative for rash.  Neurological: Negative for dizziness and headaches.   Physical Exam   Blood pressure 118/61, pulse 73, temperature 98.3 F (36.8 C), temperature source Oral,  resp. rate 18, height 4' 11.5" (1.511 m), weight 54.432 kg (120 lb), unknown if currently breastfeeding.  Physical Exam  Constitutional: She is oriented to person, place, and time. She appears well-developed and well-nourished. No distress.  HENT:  Head: Normocephalic and atraumatic.  Eyes: Conjunctivae normal and EOM are normal.  Neck: Normal range of motion. Neck supple.  Cardiovascular: Normal rate, regular rhythm and normal heart sounds.   Respiratory: Effort normal and breath sounds normal. No respiratory distress.  GI: Soft. She exhibits no distension. There is tenderness (suprapubic, LLQ, RLQ- mild). There is no rebound and no guarding.  Genitourinary: Vagina normal and uterus normal.       Normal external genitalia and vagina. No discharge. Normal cervix. No CMT. Uterus normal size, not fixed or deviated. No adnexal tenderness or masses. No blood.  Musculoskeletal: Normal range of motion. She exhibits no edema and no tenderness.  Neurological: She is alert and oriented to person, place, and time.  Skin: Skin is warm and dry.  Psychiatric: She has a normal mood and affect.    [name] Course  Procedures  Results for orders placed during the hospital encounter of 05/08/12 (from the past 24 hour(s))  WET PREP, GENITAL     Status: Abnormal   Collection Time   05/08/12  6:35 PM      Component Value Range   Yeast Wet Prep HPF POC NONE SEEN  NONE SEEN  Trich, Wet Prep NONE SEEN  NONE SEEN   Clue Cells Wet Prep HPF POC NONE SEEN  NONE SEEN   WBC, Wet Prep HPF POC MODERATE (*) NONE SEEN  URINALYSIS, ROUTINE W REFLEX MICROSCOPIC     Status: Abnormal   Collection Time   05/08/12  6:45 PM      Component Value Range   Color, Urine YELLOW  YELLOW   APPearance CLEAR  CLEAR   Specific Gravity, Urine 1.025  1.005 - 1.030   pH 6.0  5.0 - 8.0   Glucose, UA NEGATIVE  NEGATIVE mg/dL   Hgb urine dipstick LARGE (*) NEGATIVE   Bilirubin Urine NEGATIVE  NEGATIVE   Ketones, ur NEGATIVE  NEGATIVE  mg/dL   Protein, ur NEGATIVE  NEGATIVE mg/dL   Urobilinogen, UA 0.2  0.0 - 1.0 mg/dL   Nitrite NEGATIVE  NEGATIVE   Leukocytes, UA NEGATIVE  NEGATIVE  URINE MICROSCOPIC-ADD ON     Status: Abnormal   Collection Time   05/08/12  6:45 PM      Component Value Range   Squamous Epithelial / LPF FEW (*) RARE   WBC, UA 0-2  <3 WBC/hpf   RBC / HPF 3-6  <3 RBC/hpf   Bacteria, UA FEW (*) RARE   Urine-Other MUCOUS PRESENT    POCT PREGNANCY, URINE     Status: Normal   Collection Time   05/08/12  6:54 PM      Component Value Range   Preg Test, Ur NEGATIVE  NEGATIVE     Assessment and Plan  41 y.o. [id number] here for postpartum visit with pelvic pain/cramping. Suspect return of menses though patient not actively bleeding at this time.  Urine with blood but no signs of infection. Will send for culture.  Wet prep negative.  Ibuprofen for pain.   Napoleon Form 05/08/2012, 7:21 PM

## 2012-05-09 LAB — GC/CHLAMYDIA PROBE AMP, GENITAL: Chlamydia, DNA Probe: NEGATIVE

## 2013-03-30 IMAGING — US US OB LIMITED
1 series · 14 of 28 positions shown · non-contrast
Comparison: none

[Series 1: us ob transvaginal · 14 of 28 slices shown]
[im 2/28]
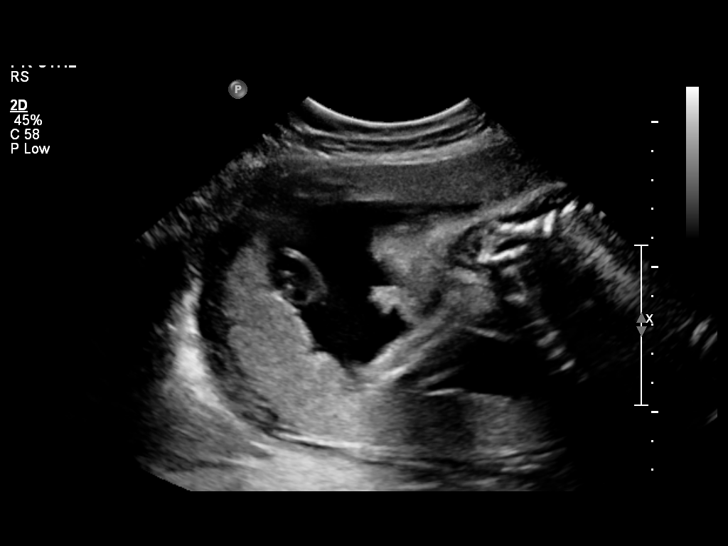
[im 4/28]
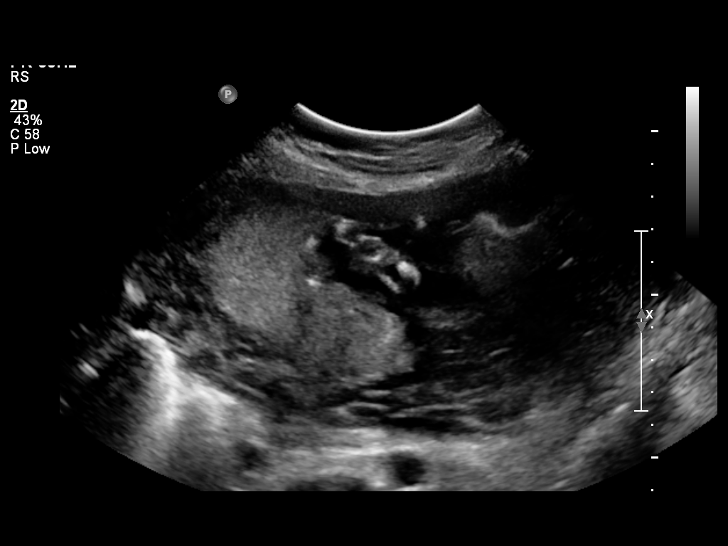
[im 6/28]
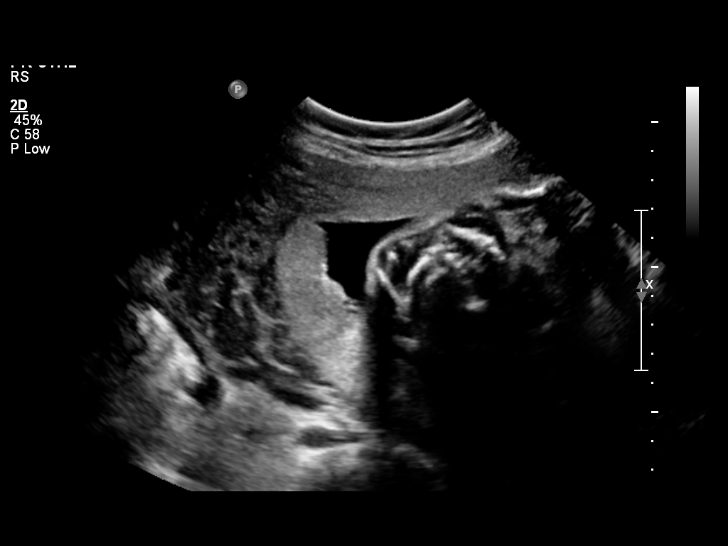
[im 8/28]
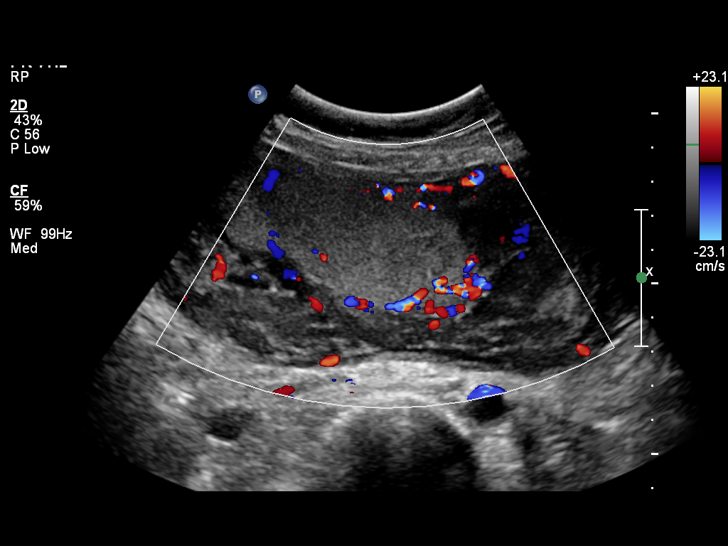
[im 10/28]
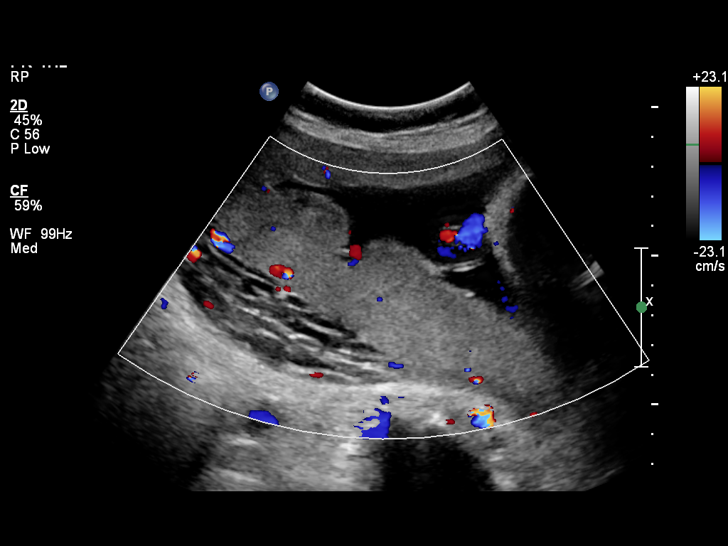
[im 12/28]
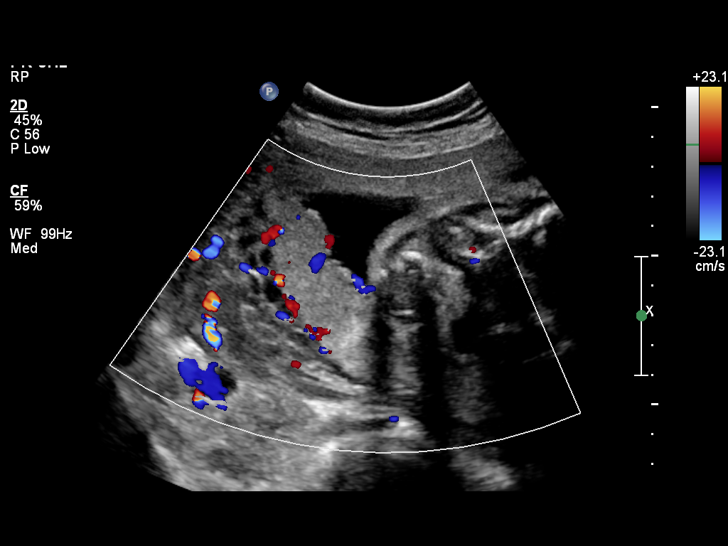
[im 14/28]
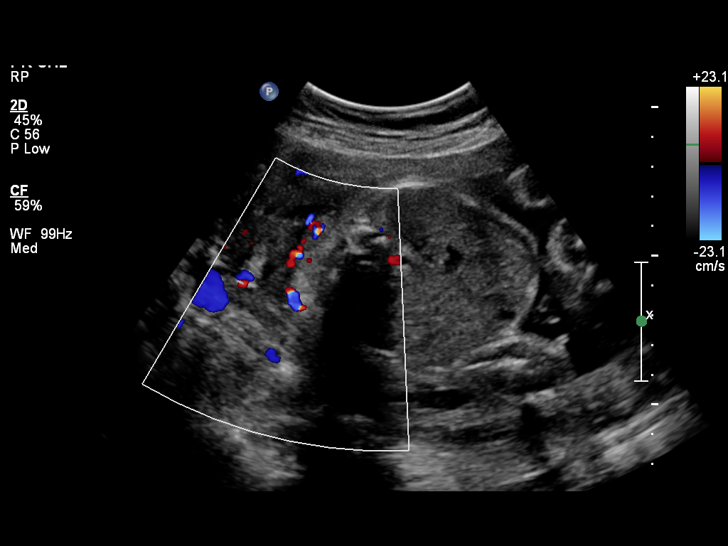
[im 16/28]
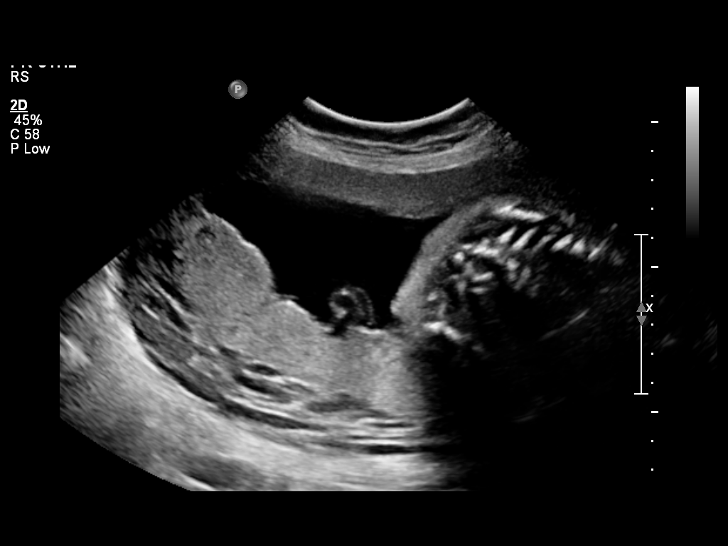
[im 18/28]
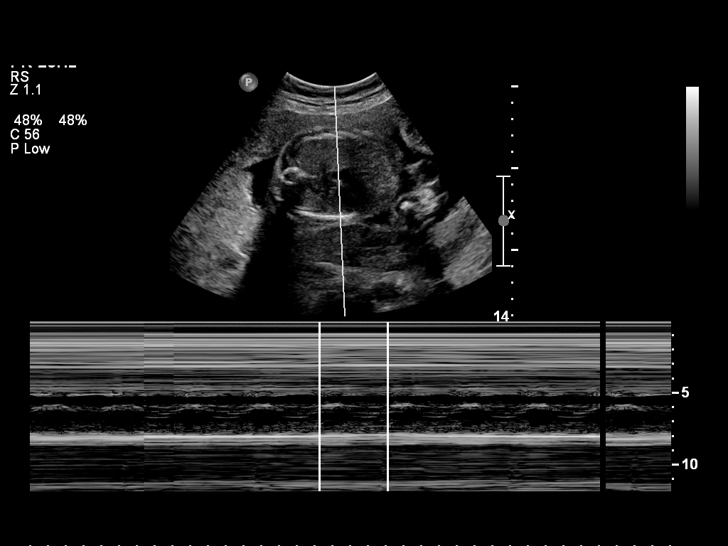
[im 20/28]
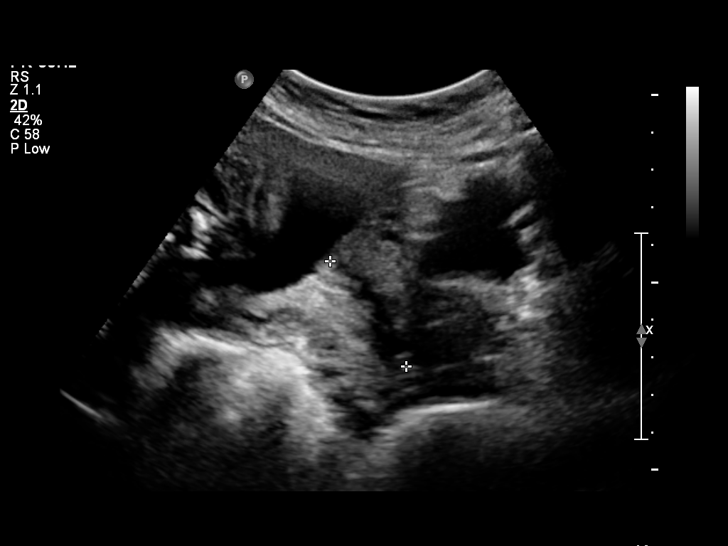
[im 22/28]
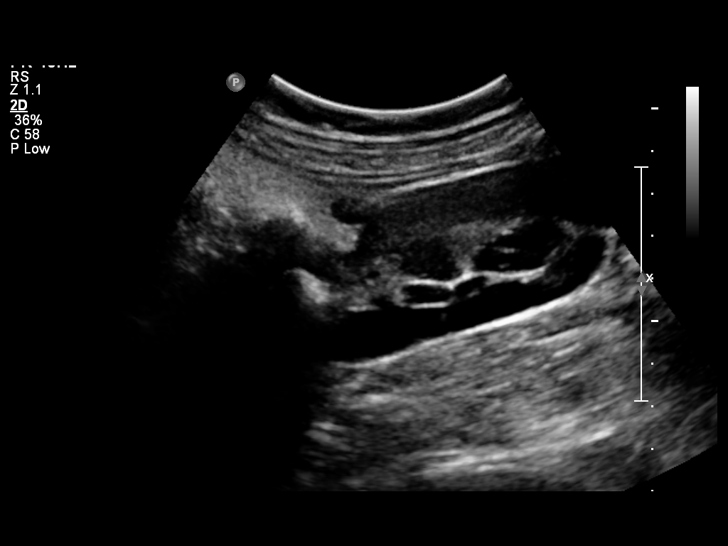
[im 24/28]
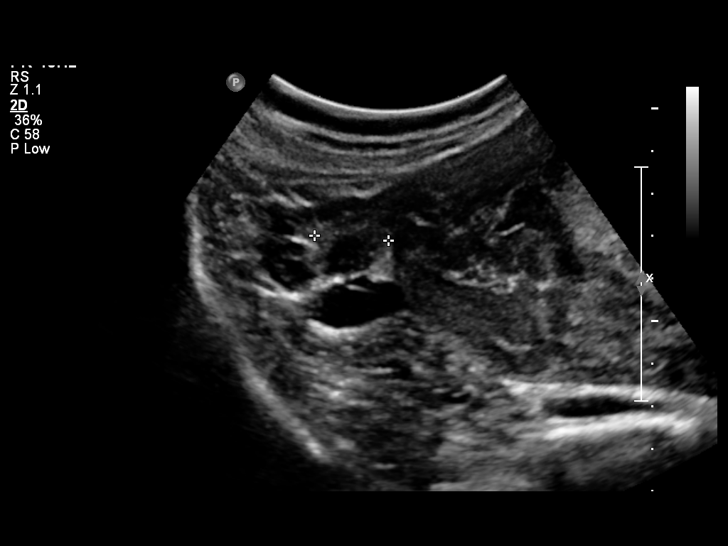
[im 26/28]
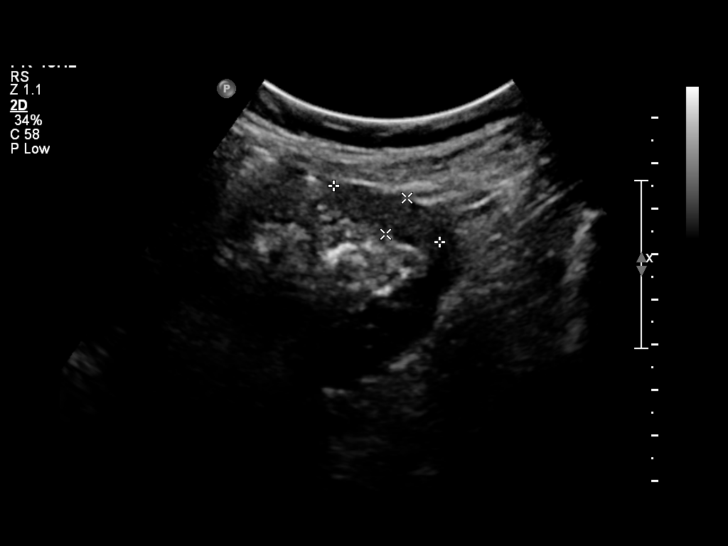
[im 28/28]
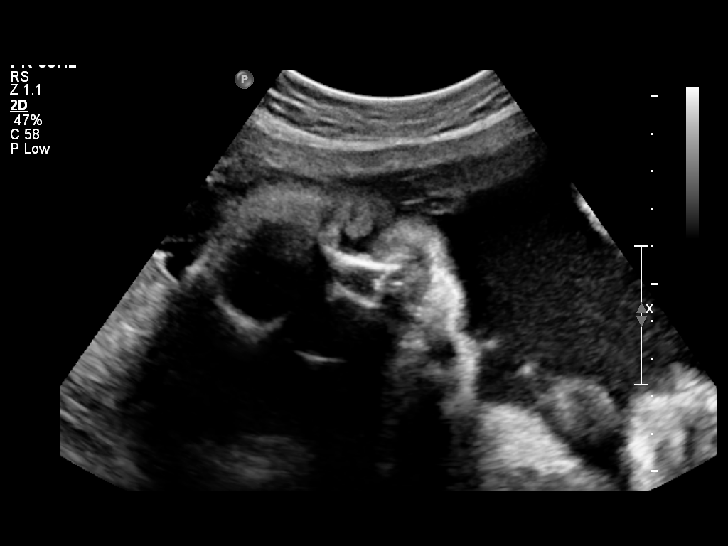

[14 of 28 positions shown; findings below may reference images not displayed]

OBSTETRICS REPORT
                      (Signed Final 09/03/2011 [DATE])

                 21_E
Procedures

 US OB TRANSVAGINAL                                    76817.0
 [HOSPITAL]                                         76815.0
Indications

 Vaginal bleeding, unknown etiology
Fetal Evaluation

 Fetal Heart Rate:  156                         bpm
 Cardiac Activity:  Observed
 Presentation:      Breech
 Placenta:          Posterior, above cervical
                    os
 P. Cord            Appears WNL
 Insertion:

 Comment:    No placental abruption or previa identified.

 Amniotic Fluid
 AFI FV:      Subjectively within normal limits
                                             Larg Pckt:     5.4  cm
Gestational Age

 Best:          27w 2d    Det. By:   U/S C R L (05/23/11)     EDD:   12/01/11
Cervix Uterus Adnexa

 Cervical Length:   3.5       cm

 Cervix:       Normal appearance by transabdominal scan.

 Left Ovary:   Within normal limits.
 Right Ovary:  Within normal limits.
 Adnexa:     No abnormality visualized.
Impression

 Single live IUP in breech presentation.   No acute
 abnormality.
 questions or concerns.

## 2014-05-12 ENCOUNTER — Ambulatory Visit (INDEPENDENT_AMBULATORY_CARE_PROVIDER_SITE_OTHER): Payer: Self-pay | Admitting: Family Medicine

## 2014-05-12 DIAGNOSIS — N898 Other specified noninflammatory disorders of vagina: Secondary | ICD-10-CM

## 2014-05-12 LAB — POCT UA - MICROSCOPIC ONLY
CASTS, UR, LPF, POC: NEGATIVE
Crystals, Ur, HPF, POC: NEGATIVE
YEAST UA: NEGATIVE

## 2014-05-12 LAB — POCT URINALYSIS DIPSTICK
BILIRUBIN UA: NEGATIVE
Glucose, UA: NEGATIVE
Ketones, UA: NEGATIVE
NITRITE UA: NEGATIVE
PH UA: 7
Protein, UA: NEGATIVE
Spec Grav, UA: 1.02
Urobilinogen, UA: 0.2

## 2014-05-12 LAB — POCT WET PREP WITH KOH
CLUE CELLS WET PREP PER HPF POC: NEGATIVE
KOH Prep POC: NEGATIVE
RBC WET PREP PER HPF POC: NEGATIVE
Trichomonas, UA: NEGATIVE
Yeast Wet Prep HPF POC: NEGATIVE

## 2014-05-12 MED ORDER — METRONIDAZOLE 500 MG PO TABS: 500.0000 mg | ORAL_TABLET | Freq: Two times a day (BID) | ORAL | Status: DC

## 2014-05-12 MED ORDER — FLUCONAZOLE 150 MG PO TABS: 150.0000 mg | ORAL_TABLET | Freq: Once | ORAL | Status: DC

## 2014-05-12 NOTE — Progress Notes (Signed)
Subjective:    Patient ID: Molly Guerrero, female    DOB: 04/15/1971, 43 y.o.   MRN: [id number] Chief Complaint  Patient presents with  . Vaginal Discharge    pt has been having discharge that is white in color  . other    pt has been having burning    HPI vaginal discharge and itching for months. Has not tried any otc meds or treatments.  Also have some burning at the end of urination. No f/c. Is having abd pain but no n/v.  Still menstruating and vaginal discharge resumes immed after. Has vaginal itching during menses but they are otherwise normal.  Is married.  Is here with a friend who is translating for her.  Past Medical History  Diagnosis Date  . UTI (urinary tract infection) during pregnancy    No current outpatient prescriptions on file prior to visit.   No current facility-administered medications on file prior to visit.   No Known Allergies   Review of Systems  Constitutional: Negative for fever, chills, diaphoresis, activity change, appetite change, fatigue and unexpected weight change.  Gastrointestinal: Positive for abdominal pain. Negative for nausea, vomiting, diarrhea, constipation, blood in stool, anal bleeding and rectal pain.  Genitourinary: Positive for dysuria, vaginal bleeding and vaginal discharge. Negative for urgency, frequency, hematuria, decreased urine volume, difficulty urinating, genital sores, vaginal pain, menstrual problem, pelvic pain and dyspareunia.  Musculoskeletal: Negative for gait problem.  Skin: Negative for rash.  Hematological: Negative for adenopathy.  Psychiatric/Behavioral: The patient is not nervous/anxious.        Objective:  There were no vitals taken for this visit.  Physical Exam  Constitutional: She is oriented to person, place, and time. She appears well-developed and well-nourished. No distress.  HENT:  Head: Normocephalic and atraumatic.  Cardiovascular: Normal rate, regular rhythm, normal heart sounds and intact distal  pulses.   Pulmonary/Chest: Effort normal and breath sounds normal.  Abdominal: Soft. Bowel sounds are normal. She exhibits no distension. There is no tenderness. There is no rebound and no guarding.  Genitourinary: Uterus normal. Pelvic exam was performed with patient supine. There is no rash, tenderness or lesion on the right labia. There is no rash, tenderness or lesion on the left labia. Cervix exhibits no motion tenderness and no friability. Right adnexum displays no mass, no tenderness and no fullness. Left adnexum displays no mass, no tenderness and no fullness. No erythema or tenderness around the vagina. Vaginal discharge found.  Lymphadenopathy:       Right: No inguinal adenopathy present.       Left: No inguinal adenopathy present.  Neurological: She is alert and oriented to person, place, and time.  Skin: Skin is warm and dry. She is not diaphoretic.  Psychiatric: She has a normal mood and affect. Her behavior is normal.          Assessment & Plan:   Vaginal discharge - Plan: POCT urinalysis dipstick, POCT UA - Microscopic Only, POCT Wet Prep with KOH, Pap IG w/ reflex to HPV when ASC-U  Meds ordered this encounter  Medications  . metroNIDAZOLE (FLAGYL) 500 MG tablet    Sig: Take 1 tablet (500 mg total) by mouth 2 (two) times daily.    Dispense:  14 tablet    Refill:  0  . fluconazole (DIFLUCAN) 150 MG tablet    Sig: Take 1 tablet (150 mg total) by mouth once. After antibiotic course is complete. Repeat if needed after 3 days    Dispense:  1 tablet    Refill:  1    I personally performed the services described in this documentation, which was scribed in my presence. The recorded information has been reviewed and considered, and addended by me as needed.  Norberto SorensonEva Shaw, MD MPH  Results for orders placed in visit on 05/12/14  POCT URINALYSIS DIPSTICK      Result Value Ref Range   Color, UA yellow     Clarity, UA clear     Glucose, UA neg     Bilirubin, UA neg      Ketones, UA neg     Spec Grav, UA 1.020     Blood, UA small     pH, UA 7.0     Protein, UA neg     Urobilinogen, UA 0.2     Nitrite, UA neg     Leukocytes, UA Trace    POCT UA - MICROSCOPIC ONLY      Result Value Ref Range   WBC, Ur, HPF, POC 2-4     RBC, urine, microscopic 3-5     Bacteria, U Microscopic trace     Mucus, UA trace     Epithelial cells, urine per micros 2-4     Crystals, Ur, HPF, POC neg     Casts, Ur, LPF, POC neg     Yeast, UA neg    POCT WET PREP WITH KOH      Result Value Ref Range   Trichomonas, UA Negative     Clue Cells Wet Prep HPF POC neg     Epithelial Wet Prep HPF POC 3-5     Yeast Wet Prep HPF POC neg     Bacteria Wet Prep HPF POC small     RBC Wet Prep HPF POC neg     WBC Wet Prep HPF POC 3-5     KOH Prep POC Negative    PAP IG W/ RFLX HPV ASCU      Result Value Ref Range   Specimen adequacy: SEE NOTE     FINAL DIAGNOSIS: SEE NOTE     COMMENTS: SEE NOTE     Cytotechnologist: SEE NOTE

## 2014-05-12 NOTE — Patient Instructions (Signed)
The orange card program is sponsored by the [facility]. Call [phone] to see if this might be something for which you are eligible. This can ensure you have access to the medical and dental services that [facility] help provide to people without health insurance. Please call the clinic below to get an appointment asap.  They are a clinic set up by [facility] to care for people without health insurance so may be able to get you care at much cheaper cost with a large discount on labs, imaging, and medication. [facility] [address] [place], [place] [address] Hours of Operation Mon - Fri: 9 a.m. - 6 p.m. Main: [phone]  Also try calling the [facility] - they have a scholarship fund for routine medical care (pap smears and mammograms.) Please call the [facility] A[facility] office at [phone] for more informations. The [facility] also does papsmears and infection testing for women at very reasonable rates.  Bacterial Vaginosis Bacterial vaginosis is a vaginal infection that occurs when the normal balance of bacteria in the vagina is disrupted. It results from an overgrowth of certain bacteria. This is the most common vaginal infection in women of childbearing age. Treatment is important to prevent complications, especially in pregnant women, as it can cause a premature delivery. CAUSES  Bacterial vaginosis is caused by an increase in harmful bacteria that are normally present in smaller amounts in the vagina. Several different kinds of bacteria can cause bacterial vaginosis. However, the reason that the condition develops is not fully understood. RISK FACTORS Certain activities or behaviors can put you at an increased risk of developing bacterial vaginosis, including:  Having a new sex partner or multiple sex partners.  Douching.  Using an intrauterine  device (IUD) for contraception. Women do not get bacterial vaginosis from toilet seats, bedding, swimming pools, or contact with objects around them. SIGNS AND SYMPTOMS  Some women with bacterial vaginosis have no signs or symptoms. Common symptoms include:  Grey vaginal discharge.  A fishlike odor with discharge, especially after sexual intercourse.  Itching or burning of the vagina and vulva.  Burning or pain with urination. DIAGNOSIS  Your health care provider will take a medical history and examine the vagina for signs of bacterial vaginosis. A sample of vaginal fluid may be taken. Your health care provider will look at this sample under a microscope to check for bacteria and abnormal cells. A vaginal pH test may also be done.  TREATMENT  Bacterial vaginosis may be treated with antibiotic medicines. These may be given in the form of a pill or a vaginal cream. A second round of antibiotics may be prescribed if the condition comes back after treatment.  HOME CARE INSTRUCTIONS   Only take over-the-counter or prescription medicines as directed by your health care provider.  If antibiotic medicine was prescribed, take it as directed. Make sure you finish it even if you start to feel better.  Do not have sex until treatment is completed.  Tell all sexual partners that you have a vaginal infection. They should see their health care provider and be treated if they have problems, such as a mild rash or itching.  Practice safe sex by using condoms and only having one sex partner. SEEK MEDICAL CARE IF:   Your symptoms are not improving after 3 days of treatment.  You have increased discharge or pain.  You have a  fever. MAKE SURE YOU:   Understand these instructions.  Will watch your condition.  Will get help right away if you are not doing well or get worse. FOR MORE INFORMATION  Centers for Disease Control and Prevention, Division of STD Prevention: SolutionApps.co.zawww.cdc.gov/std American  Sexual Health Association (ASHA): www.ashastd.org  Document Released: 07/18/2005 Document Revised: 05/08/2013 Document Reviewed: 02/27/2013 [facility] Patient Information 2015 [place] [employer] [place]. This information is not intended to replace advice given to you by your health care provider. Make sure you discuss any questions you have with your health care provider.

## 2014-05-14 ENCOUNTER — Encounter: Payer: Self-pay | Admitting: Family Medicine

## 2014-05-14 LAB — PAP IG W/ RFLX HPV ASCU

## 2014-07-14 ENCOUNTER — Inpatient Hospital Stay (HOSPITAL_COMMUNITY)
Admission: AD | Admit: 2014-07-14 | Discharge: 2014-07-14 | Disposition: A | Discharge status: Home / Self Care | Payer: Medicaid Other | Source: Ambulatory Visit | Attending: Family Medicine | Admitting: Family Medicine

## 2014-07-14 ENCOUNTER — Encounter (HOSPITAL_COMMUNITY): Payer: Self-pay | Admitting: *Deleted

## 2014-07-14 DIAGNOSIS — N898 Other specified noninflammatory disorders of vagina: Secondary | ICD-10-CM | POA: Diagnosis present

## 2014-07-14 DIAGNOSIS — B373 Candidiasis of vulva and vagina: Secondary | ICD-10-CM | POA: Diagnosis not present

## 2014-07-14 DIAGNOSIS — B3731 Acute candidiasis of vulva and vagina: Secondary | ICD-10-CM

## 2014-07-14 LAB — WET PREP, GENITAL
Clue Cells Wet Prep HPF POC: NONE SEEN
Trich, Wet Prep: NONE SEEN
Yeast Wet Prep HPF POC: NONE SEEN

## 2014-07-14 LAB — URINALYSIS, ROUTINE W REFLEX MICROSCOPIC
Bilirubin Urine: NEGATIVE
Glucose, UA: NEGATIVE mg/dL
Ketones, ur: NEGATIVE mg/dL
LEUKOCYTES UA: NEGATIVE
NITRITE: NEGATIVE
PROTEIN: NEGATIVE mg/dL
Specific Gravity, Urine: <1.005 — ABNORMAL LOW (ref 1.005–1.030)
UROBILINOGEN UA: 0.2 mg/dL (ref 0.0–1.0)
pH: 6 (ref 5.0–8.0)

## 2014-07-14 LAB — URINE MICROSCOPIC-ADD ON

## 2014-07-14 LAB — POCT PREGNANCY, URINE: PREG TEST UR: NEGATIVE

## 2014-07-14 MED ORDER — FLUCONAZOLE 150 MG PO TABS
150.0000 mg | ORAL_TABLET | Freq: Once | ORAL | Status: AC
  Administered 2014-07-14: 150 mg via ORAL
  Filled 2014-07-14: qty 1

## 2014-07-14 MED ORDER — FLUCONAZOLE 150 MG PO TABS: 150.0000 mg | ORAL_TABLET | Freq: Once | ORAL | Status: DC

## 2014-07-14 NOTE — Discharge Instructions (Signed)

## 2014-07-14 NOTE — MAU Provider Note (Signed)
Subjective   Molly Guerrero is a 43 y.o. female [phone] who presents with white vaginal discharge. She was seen by her PCP in October by her PCP and had a yeast infection. She did not fill the medication because she did not understand that the medication was called into her pharmacy.  She continues to have burning, itching and white discharge.   Objective:  GENERAL: Well-developed, well-nourished female in no acute distress.  HEENT: Normocephalic, atraumatic.   LUNGS: Effort normal HEART: Regular rate  SKIN: Warm, dry and without erythema PSYCH: Normal mood and affect  Filed Vitals:   07/14/14 1033  BP: 123/65  Pulse: 72  Temp: 98.3 F (36.8 C)  TempSrc: Oral  Resp: 18  Height: 5' 0.5" (1.537 m)  Weight: 59.932 kg (132 lb 2 oz)   Results for orders placed or performed during the hospital encounter of 07/14/14 (from the past 48 hour(s))  Urinalysis, Routine w reflex microscopic     Status: Abnormal   Collection Time: 07/14/14 10:46 AM  Result Value Ref Range   Color, Urine YELLOW YELLOW   APPearance CLEAR CLEAR   Specific Gravity, Urine <1.005 (L) 1.005 - 1.030   pH 6.0 5.0 - 8.0   Glucose, UA NEGATIVE NEGATIVE mg/dL   Hgb urine dipstick SMALL (A) NEGATIVE   Bilirubin Urine NEGATIVE NEGATIVE   Ketones, ur NEGATIVE NEGATIVE mg/dL   Protein, ur NEGATIVE NEGATIVE mg/dL   Urobilinogen, UA 0.2 0.0 - 1.0 mg/dL   Nitrite NEGATIVE NEGATIVE   Leukocytes, UA NEGATIVE NEGATIVE  Urine microscopic-add on     Status: Abnormal   Collection Time: 07/14/14 10:46 AM  Result Value Ref Range   Squamous Epithelial / LPF FEW (A) RARE   RBC / HPF 0-2 <3 RBC/hpf   Bacteria, UA RARE RARE  Pregnancy, urine POC     Status: None   Collection Time: 07/14/14 10:53 AM  Result Value Ref Range   Preg Test, Ur NEGATIVE NEGATIVE    Comment:        THE SENSITIVITY OF THIS METHODOLOGY IS >24 mIU/mL   Wet prep, genital     Status: Abnormal   Collection Time: 07/14/14 11:10 AM  Result Value Ref Range    Yeast Wet Prep HPF POC NONE SEEN NONE SEEN   Trich, Wet Prep NONE SEEN NONE SEEN   Clue Cells Wet Prep HPF POC NONE SEEN NONE SEEN   WBC, Wet Prep HPF POC FEW (A) NONE SEEN    Comment: MODERATE BACTERIA SEEN   MDM UA UPT Wet prep Diflucan 150 mg PO in [name]   Diflucan 150 mg PO in [name]  Assessment: Yeast vaginitis  Plan:  Discharge home in stable condition RX: diflucan Follow up with PCP as scheduled Return to [name] for emergencies   Iona HansenJennifer Irene Carless Slatten, NP 07/14/2014 11:26 AM

## 2014-07-14 NOTE — MAU Note (Addendum)
Patient presents with complaint of noticing a white discharge when urinating. States she has burning when she urinates. Denies bleeding.

## 2014-07-14 NOTE — MAU Note (Signed)
Pt presents with complaints of pain with urination with white discharge. Medications called to pharmacy in October Not picked up because pt did not know they were called in. The pt nor her husband speak english and did not know plan of care

## 2014-11-02 ENCOUNTER — Encounter (HOSPITAL_COMMUNITY): Payer: Self-pay

## 2014-11-02 ENCOUNTER — Emergency Department (HOSPITAL_COMMUNITY): Payer: No Typology Code available for payment source

## 2014-11-02 ENCOUNTER — Emergency Department (HOSPITAL_COMMUNITY)
Admission: EM | Admit: 2014-11-02 | Discharge: 2014-11-02 | Disposition: A | Discharge status: Home / Self Care | Payer: No Typology Code available for payment source | Attending: Emergency Medicine | Admitting: Emergency Medicine

## 2014-11-02 DIAGNOSIS — Y92410 Unspecified street and highway as the place of occurrence of the external cause: Secondary | ICD-10-CM | POA: Diagnosis not present

## 2014-11-02 DIAGNOSIS — S0181XA Laceration without foreign body of other part of head, initial encounter: Secondary | ICD-10-CM | POA: Diagnosis present

## 2014-11-02 DIAGNOSIS — Y9389 Activity, other specified: Secondary | ICD-10-CM | POA: Diagnosis not present

## 2014-11-02 DIAGNOSIS — Z87440 Personal history of urinary (tract) infections: Secondary | ICD-10-CM | POA: Diagnosis not present

## 2014-11-02 DIAGNOSIS — Y998 Other external cause status: Secondary | ICD-10-CM | POA: Insufficient documentation

## 2014-11-02 MED ORDER — ACETAMINOPHEN 500 MG PO TABS
1000.0000 mg | ORAL_TABLET | Freq: Once | ORAL | Status: AC
  Administered 2014-11-02: 1000 mg via ORAL
  Filled 2014-11-02: qty 2

## 2014-11-02 MED ORDER — LIDOCAINE-EPINEPHRINE 1 %-1:100000 IJ SOLN
5.0000 mL | Freq: Once | INTRAMUSCULAR | Status: AC
  Administered 2014-11-02: 5 mL via INTRADERMAL
  Filled 2014-11-02: qty 1
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
## 2014-11-02 NOTE — Discharge Instructions (Signed)
Facial Laceration °A facial laceration is a cut on the face. These injuries can be painful and cause bleeding. Some cuts may need to be closed with stitches (sutures), skin adhesive strips, or wound glue. Cuts usually heal quickly but can leave a scar. It can take 1-2 years for the scar to go away completely. °HOME CARE  °· Only take medicines as told by your doctor. °· Follow your doctor's instructions for wound care. °For Stitches: °· Keep the cut clean and dry. °· If you have a bandage (dressing), change it at least once a day. Change the bandage if it gets wet or dirty, or as told by your doctor. °· Wash the cut with soap and water 2 times a day. Rinse the cut with water. Pat it dry with a clean towel. °· Put a thin layer of medicated cream on the cut as told by your doctor. °· You may shower after the first 24 hours. Do not soak the cut in water until the stitches are removed. °· Have your stitches removed as told by your doctor. °· Do not wear any makeup until a few days after your stitches are removed. °For Skin Adhesive Strips: °· Keep the cut clean and dry. °· Do not get the strips wet. You may take a bath, but be careful to keep the cut dry. °· If the cut gets wet, pat it dry with a clean towel. °· The strips will fall off on their own. Do not remove the strips that are still stuck to the cut. °For Wound Glue: °· You may shower or take baths. Do not soak or scrub the cut. Do not swim. Avoid heavy sweating until the glue falls off on its own. After a shower or bath, pat the cut dry with a clean towel. °· Do not put medicine or makeup on your cut until the glue falls off. °· If you have a bandage, do not put tape over the glue. °· Avoid lots of sunlight or tanning lamps until the glue falls off. °· The glue will fall off on its own in 5-10 days. Do not pick at the glue. °After Healing: °Put sunscreen on the cut for the first year to reduce your scar. °GET HELP RIGHT AWAY IF:  °· Your cut area gets red,  painful, or puffy (swollen). °· You see a yellowish-white fluid (pus) coming from the cut. °· You have chills or a fever. °MAKE SURE YOU:  °· Understand these instructions. °· Will watch your condition. °· Will get help right away if you are not doing well or get worse. °Document Released: 01/04/2008 Document Revised: 05/08/2013 Document Reviewed: 02/28/2013 °ExitCare® Patient Information ©2015 [employer]. This information is not intended to replace advice given to you by your health care provider. Make sure you discuss any questions you have with your health care provider. ° °
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
## 2014-11-08 ENCOUNTER — Encounter (HOSPITAL_COMMUNITY): Payer: Self-pay | Admitting: *Deleted

## 2014-11-08 ENCOUNTER — Emergency Department (HOSPITAL_COMMUNITY)
Admission: EM | Admit: 2014-11-08 | Discharge: 2014-11-08 | Disposition: A | Discharge status: Home / Self Care | Payer: Medicaid Other | Attending: Emergency Medicine | Admitting: Emergency Medicine

## 2014-11-08 DIAGNOSIS — Z87440 Personal history of urinary (tract) infections: Secondary | ICD-10-CM | POA: Insufficient documentation

## 2014-11-08 DIAGNOSIS — Z4802 Encounter for removal of sutures: Secondary | ICD-10-CM | POA: Insufficient documentation

## 2014-11-08 DIAGNOSIS — R42 Dizziness and giddiness: Secondary | ICD-10-CM | POA: Diagnosis not present

## 2014-11-08 DIAGNOSIS — Z4801 Encounter for change or removal of surgical wound dressing: Secondary | ICD-10-CM | POA: Diagnosis present

## 2014-11-08 DIAGNOSIS — Z3202 Encounter for pregnancy test, result negative: Secondary | ICD-10-CM | POA: Insufficient documentation

## 2014-11-08 LAB — URINALYSIS, ROUTINE W REFLEX MICROSCOPIC
Bilirubin Urine: NEGATIVE
Glucose, UA: NEGATIVE mg/dL
Ketones, ur: NEGATIVE mg/dL
LEUKOCYTES UA: NEGATIVE
Nitrite: NEGATIVE
PROTEIN: NEGATIVE mg/dL
SPECIFIC GRAVITY, URINE: 1.023 (ref 1.005–1.030)
Urobilinogen, UA: 0.2 mg/dL (ref 0.0–1.0)
pH: 5.5 (ref 5.0–8.0)

## 2014-11-08 LAB — URINE MICROSCOPIC-ADD ON

## 2014-11-08 LAB — POC URINE PREG, ED: PREG TEST UR: NEGATIVE

## 2014-11-08 NOTE — ED Notes (Signed)
Report to Josie DixonElizabeth P. RN
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
## 2014-11-08 NOTE — ED Notes (Signed)
Dsy removed and incision cleaned with SNS. Pt also reported her eyes were itchy and her throat was itchy.

## 2014-11-08 NOTE — ED Notes (Signed)
PT returns today to have Lac on forehead rechecked. Pt has dsy placed on 11-02-14 still in place .
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
## 2014-11-08 NOTE — Discharge Instructions (Signed)
As discussed, your evaluation today has been largely reassuring.  But, it is important that you monitor your condition carefully, and do not hesitate to return to the ED if you develop new, or concerning changes in your condition. ° °Otherwise, please follow-up with your physician for appropriate ongoing care. ° ° °Dizziness °Dizziness is a common problem. It is a feeling of unsteadiness or light-headedness. You may feel like you are about to faint. Dizziness can lead to injury if you stumble or fall. A person of any age group can suffer from dizziness, but dizziness is more common in older adults. °CAUSES  °Dizziness can be caused by many different things, including: °· Middle ear problems. °· Standing for too long. °· Infections. °· An allergic reaction. °· Aging. °· An emotional response to something, such as the sight of blood. °· Side effects of medicines. °· Tiredness. °· Problems with circulation or blood pressure. °· Excessive use of alcohol or medicines, or illegal drug use. °· Breathing too fast (hyperventilation). °· An irregular heart rhythm (arrhythmia). °· A low red blood cell count (anemia). °· Pregnancy. °· Vomiting, diarrhea, fever, or other illnesses that cause body fluid loss (dehydration). °· Diseases or conditions such as Parkinson's disease, high blood pressure (hypertension), diabetes, and thyroid problems. °· Exposure to extreme heat. °DIAGNOSIS  °Your health care provider will ask about your symptoms, perform a physical exam, and perform an electrocardiogram (ECG) to record the electrical activity of your heart. Your health care provider may also perform other heart or blood tests to determine the cause of your dizziness. These may include: °· Transthoracic echocardiogram (TTE). During echocardiography, sound waves are used to evaluate how blood flows through your heart. °· Transesophageal echocardiogram (TEE). °· Cardiac monitoring. This allows your health care provider to monitor your  heart rate and rhythm in real time. °· Holter monitor. This is a portable device that records your heartbeat and can help diagnose heart arrhythmias. It allows your health care provider to track your heart activity for several days if needed. °· Stress tests by exercise or by giving medicine that makes the heart beat faster. °TREATMENT  °Treatment of dizziness depends on the cause of your symptoms and can vary greatly. °HOME CARE INSTRUCTIONS  °· Drink enough fluids to keep your urine clear or pale yellow. This is especially important in very hot weather. In older adults, it is also important in cold weather. °· Take your medicine exactly as directed if your dizziness is caused by medicines. When taking blood pressure medicines, it is especially important to get up slowly. °¨ Rise slowly from chairs and steady yourself until you feel okay. °¨ In the morning, first sit up on the side of the bed. When you feel okay, stand slowly while holding onto something until you know your balance is fine. °· Move your legs often if you need to stand in one place for a long time. Tighten and relax your muscles in your legs while standing. °· Have someone stay with you for 1-2 days if dizziness continues to be a problem. Do this until you feel you are well enough to stay alone. Have the person call your health care provider if he or she notices changes in you that are concerning. °· Do not drive or use heavy machinery if you feel dizzy. °· Do not drink alcohol. °SEEK IMMEDIATE MEDICAL CARE IF:  °· Your dizziness or light-headedness gets worse. °· You feel nauseous or vomit. °· You have problems talking, walking, or   using your arms, hands, or legs. °· You feel weak. °· You are not thinking clearly or you have trouble forming sentences. It may take a friend or family member to notice this. °· You have chest pain, abdominal pain, shortness of breath, or sweating. °· Your vision changes. °· You notice any bleeding. °· You have side  effects from medicine that seems to be getting worse rather than better. °MAKE SURE YOU:  °· Understand these instructions. °· Will watch your condition. °· Will get help right away if you are not doing well or get worse. °Document Released: 01/11/2001 Document Revised: 07/23/2013 Document Reviewed: 02/04/2011 °[employer]® Patient Information ©2015 [employer]. This information is not intended to replace advice given to you by your health care provider. Make sure you discuss any questions you have with your health care provider. ° °

## 2015-01-19 ENCOUNTER — Inpatient Hospital Stay (HOSPITAL_COMMUNITY)
Admission: AD | Admit: 2015-01-19 | Discharge: 2015-01-19 | Disposition: A | Discharge status: Home / Self Care | Payer: Medicaid Other | Source: Ambulatory Visit | Attending: Obstetrics and Gynecology | Admitting: Obstetrics and Gynecology

## 2015-01-19 ENCOUNTER — Encounter (HOSPITAL_COMMUNITY): Payer: Self-pay | Admitting: *Deleted

## 2015-01-19 ENCOUNTER — Inpatient Hospital Stay (HOSPITAL_COMMUNITY): Payer: Medicaid Other

## 2015-01-19 DIAGNOSIS — O021 Missed abortion: Secondary | ICD-10-CM

## 2015-01-19 DIAGNOSIS — R1032 Left lower quadrant pain: Secondary | ICD-10-CM | POA: Insufficient documentation

## 2015-01-19 DIAGNOSIS — O209 Hemorrhage in early pregnancy, unspecified: Secondary | ICD-10-CM

## 2015-01-19 LAB — CBC
HEMATOCRIT: 38 % (ref 36.0–46.0)
Hemoglobin: 12.8 g/dL (ref 12.0–15.0)
MCH: 22.8 pg — AB (ref 26.0–34.0)
MCHC: 33.7 g/dL (ref 30.0–36.0)
MCV: 67.7 fL — AB (ref 78.0–100.0)
PLATELETS: 207 10*3/uL (ref 150–400)
RBC: 5.61 MIL/uL — ABNORMAL HIGH (ref 3.87–5.11)
RDW: 13.8 % (ref 11.5–15.5)
WBC: 6.5 10*3/uL (ref 4.0–10.5)

## 2015-01-19 LAB — URINALYSIS, ROUTINE W REFLEX MICROSCOPIC
Bilirubin Urine: NEGATIVE
Glucose, UA: NEGATIVE mg/dL
Ketones, ur: NEGATIVE mg/dL
Leukocytes, UA: NEGATIVE
Nitrite: NEGATIVE
PH: 5.5 (ref 5.0–8.0)
Protein, ur: NEGATIVE mg/dL
Specific Gravity, Urine: >1.03 — ABNORMAL HIGH (ref 1.005–1.030)
Urobilinogen, UA: 0.2 mg/dL (ref 0.0–1.0)

## 2015-01-19 LAB — POCT PREGNANCY, URINE: PREG TEST UR: POSITIVE — AB

## 2015-01-19 LAB — WET PREP, GENITAL
CLUE CELLS WET PREP: NONE SEEN
Trich, Wet Prep: NONE SEEN
YEAST WET PREP: NONE SEEN

## 2015-01-19 LAB — HCG, QUANTITATIVE, PREGNANCY: HCG, BETA CHAIN, QUANT, S: 3807 m[IU]/mL — AB (ref <5)

## 2015-01-19 LAB — URINE MICROSCOPIC-ADD ON

## 2015-01-19 MED ORDER — IBUPROFEN 800 MG PO TABS: 800.0000 mg | ORAL_TABLET | Freq: Three times a day (TID) | ORAL | Status: DC

## 2015-01-19 MED ORDER — PROMETHAZINE HCL 25 MG PO TABS: 25.0000 mg | ORAL_TABLET | Freq: Four times a day (QID) | ORAL | Status: DC | PRN

## 2015-01-19 MED ORDER — MISOPROSTOL 200 MCG PO TABS: 800.0000 ug | ORAL_TABLET | Freq: Once | ORAL | Status: DC

## 2015-01-19 NOTE — MAU Provider Note (Signed)
History     CSN: [id number]  Arrival date and time: 01/19/15 1011   First Provider Initiated Contact with Patient 01/19/15 1048      Chief Complaint  Patient presents with  . Abdominal Pain   HPIpt is G4P2 SAB 1 [place] who presents for abd pain and bleeding in pregnancy. Pt had +HPT on 5/24.  Pt denies nausea or vomiting C/o of vaginal itching Difficult history due to language barrier- unable to get Publishing rights manager- friend is translating RN note: Registered Nurse Signed  [name] Note 01/19/2015 10:20 AM    Expand All Collapse All   Pain in lower abd, started about a month ago. Noted some bleeding when urinates/ pain ith urination, for past month. Vaginal itching         Past Medical History  Diagnosis Date  . UTI (urinary tract infection) during pregnancy     Past Surgical History  Procedure Laterality Date  . No past surgeries      Family History  Problem Relation Age of Onset  . Other Neg Hx     History  Substance Use Topics  . Smoking status: Never Smoker   . Smokeless tobacco: Never Used  . Alcohol Use: No    Allergies: No Known Allergies  No prescriptions prior to admission    Review of Systems  Constitutional: Negative for fever and chills.  Gastrointestinal: Positive for abdominal pain. Negative for nausea, vomiting, diarrhea and constipation.  Genitourinary: Positive for dysuria.   Physical Exam   Blood pressure 131/75, pulse 72, temperature 98.2 F (36.8 C), temperature source Oral, resp. rate 18, height  (1.499 m), weight 129 lb (58.514 kg), unknown if currently breastfeeding.  Physical Exam  Nursing note and vitals reviewed. Constitutional: She is oriented to person, place, and time. She appears well-developed and well-nourished. No distress.  HENT:  Head: Normocephalic.  Eyes: Pupils are equal, round, and reactive to light.  Neck: Normal range of motion. Neck supple.  Cardiovascular: Normal rate.   Respiratory: Effort  normal.  GI: Soft. She exhibits no distension. There is no tenderness. There is no rebound and no guarding.  Genitourinary:  sm- mod bright red blood from cervical os; uterus NSSC NT; adnexa without palpable enlargment or tenderness  Musculoskeletal: Normal range of motion.  Neurological: She is alert and oriented to person, place, and time.  Skin: Skin is warm and dry.  Psychiatric: She has a normal mood and affect.    [name] Course  Procedures Results for orders placed or performed during the hospital encounter of 01/19/15 (from the past 24 hour(s))  Pregnancy, urine POC     Status: Abnormal   Collection Time: 01/19/15 10:34 AM  Result Value Ref Range   Preg Test, Ur POSITIVE (A) NEGATIVE  Urinalysis, Routine w reflex microscopic (not at [facility])     Status: Abnormal   Collection Time: 01/19/15 10:36 AM  Result Value Ref Range   Color, Urine YELLOW YELLOW   APPearance HAZY (A) CLEAR   Specific Gravity, Urine >1.030 (H) 1.005 - 1.030   pH 5.5 5.0 - 8.0   Glucose, UA NEGATIVE NEGATIVE mg/dL   Hgb urine dipstick LARGE (A) NEGATIVE   Bilirubin Urine NEGATIVE NEGATIVE   Ketones, ur NEGATIVE NEGATIVE mg/dL   Protein, ur NEGATIVE NEGATIVE mg/dL   Urobilinogen, UA 0.2 0.0 - 1.0 mg/dL   Nitrite NEGATIVE NEGATIVE   Leukocytes, UA NEGATIVE NEGATIVE  Urine microscopic-add on     Status: Abnormal   Collection Time: 01/19/15  10:36 AM  Result Value Ref Range   Squamous Epithelial / LPF FEW (A) RARE   WBC, UA 0-2 <3 WBC/hpf   RBC / HPF TOO NUMEROUS TO COUNT <3 RBC/hpf   Bacteria, UA MANY (A) RARE  Wet prep, genital     Status: Abnormal   Collection Time: 01/19/15 11:00 AM  Result Value Ref Range   Yeast Wet Prep HPF POC NONE SEEN NONE SEEN   Trich, Wet Prep NONE SEEN NONE SEEN   Clue Cells Wet Prep HPF POC NONE SEEN NONE SEEN   WBC, Wet Prep HPF POC FEW (A) NONE SEEN  CBC     Status: Abnormal   Collection Time: 01/19/15  1:10 PM  Result Value Ref Range   WBC 6.5 4.0 - 10.5 K/uL   RBC  5.61 (H) 3.87 - 5.11 MIL/uL   Hemoglobin 12.8 12.0 - 15.0 g/dL   HCT 91.4 78.2 - 95.6 %   MCV 67.7 (L) 78.0 - 100.0 fL   MCH 22.8 (L) 26.0 - 34.0 pg   MCHC 33.7 30.0 - 36.0 g/dL   RDW 21.3 08.6 - 57.8 %   Platelets 207 150 - 400 K/uL  hCG, quantitative, pregnancy     Status: Abnormal   Collection Time: 01/19/15  1:10 PM  Result Value Ref Range   hCG, Beta Chain, Quant, S 3807 (H) <5 mIU/mL  US Ob Comp Less 14 Wks  01/19/2015   CLINICAL DATA:  Left lower quadrant pain and vaginal bleeding for 3 weeks. Unsure of LMP.  EXAM: OBSTETRIC <14 WK [place] AND TRANSVAGINAL OB US  TECHNIQUE: Both transabdominal and transvaginal ultrasound examinations were performed for complete evaluation of the gestation as well as the maternal uterus, adnexal regions, and pelvic cul-de-sac. Transvaginal technique was performed to assess early pregnancy.  COMPARISON:  None.  FINDINGS: Intrauterine gestational sac: Visualized/normal in shape.  Yolk sac:  Not visualized  Embryo:  Visualized  Cardiac Activity: Absent  CRL:  21  mm   8 w   5 d                  [place] EDC: 08/26/2015  Maternal uterus/adnexae: Small subchorionic hemorrhage noted. Retroverted uterus. Small left ovarian corpus luteum noted. Normal appearance of right ovary. No adnexal mass or free fluid identified.  IMPRESSION: Findings meet definitive criteria for failed pregnancy. This follows SRU consensus guidelines: Diagnostic Criteria for Nonviable Pregnancy Early in the First Trimester. [facility] Mis J Med [id number].   Electronically Signed   By: Myles Rosenthal M.D.   On: 01/19/2015 14:16   US Ob Transvaginal  01/19/2015   CLINICAL DATA:  Left lower quadrant pain and vaginal bleeding for 3 weeks. Unsure of LMP.  EXAM: OBSTETRIC <14 WK [place] AND TRANSVAGINAL OB US  TECHNIQUE: Both transabdominal and transvaginal ultrasound examinations were performed for complete evaluation of the gestation as well as the maternal uterus, adnexal regions, and pelvic cul-de-sac. Transvaginal  technique was performed to assess early pregnancy.  COMPARISON:  None.  FINDINGS: Intrauterine gestational sac: Visualized/normal in shape.  Yolk sac:  Not visualized  Embryo:  Visualized  Cardiac Activity: Absent  CRL:  21  mm   8 w   5 d                  [place] EDC: 08/26/2015  Maternal uterus/adnexae: Small subchorionic hemorrhage noted. Retroverted uterus. Small left ovarian corpus luteum noted. Normal appearance of right ovary. No adnexal mass or free fluid identified.  IMPRESSION: Findings meet definitive criteria for failed pregnancy. This follows SRU consensus guidelines: Diagnostic Criteria for Nonviable Pregnancy Early in the First Trimester. [facility]acy Mis J Med [phone].   Electronically Signed   By: Myles Rosenthal M.D.   On: 01/19/2015 14:16   Discussed failed pregnancy with pt and friend interpreting Options of waiting, cytotec or D&C discussed with pt- pt would like to "go ahead and get it over with" using medication Pt is O Pos blood type  Assessment and Plan  Failed pregnancy [redacted]w[redacted]d Rx for cytotec with instructions on use if pt decides to use F/u 2 weeks in GYN clinic post cyctotec Return to [name] for heavy bleeding more than soaking 2 pads/hr for 2 hours Written information in English given to pt and translator  Perl Folmar 01/19/2015, 11:16 AM

## 2015-01-19 NOTE — Discharge Instructions (Signed)

## 2015-01-19 NOTE — MAU Note (Signed)
Pain in lower abd, started about a month ago.  Noted some bleeding when urinates/ pain ith urination, for past month. Vaginal itching

## 2015-01-20 LAB — GC/CHLAMYDIA PROBE AMP (~~LOC~~) NOT AT ARMC
Chlamydia: NEGATIVE
Neisseria Gonorrhea: NEGATIVE

## 2015-02-05 ENCOUNTER — Ambulatory Visit: Payer: Medicaid Other | Admitting: Family Medicine

## 2015-02-09 ENCOUNTER — Ambulatory Visit: Payer: Medicaid Other | Admitting: Family Medicine

## 2015-02-16 ENCOUNTER — Ambulatory Visit (INDEPENDENT_AMBULATORY_CARE_PROVIDER_SITE_OTHER): Payer: Self-pay | Admitting: Certified Nurse Midwife

## 2015-02-16 ENCOUNTER — Encounter: Payer: Self-pay | Admitting: Certified Nurse Midwife

## 2015-02-16 VITALS — BP 116/79 | HR 67 | Temp 99.0°F | Wt 129.5 lb

## 2015-02-16 DIAGNOSIS — O039 Complete or unspecified spontaneous abortion without complication: Secondary | ICD-10-CM

## 2015-02-16 DIAGNOSIS — R300 Dysuria: Secondary | ICD-10-CM

## 2015-02-16 NOTE — Progress Notes (Signed)
Patient ID: Molly AgeeHkrel Hendryx, female   DOB: 09/18/1970, 44 y.o.   MRN: [id number] [facility] has no available interpreter for the patient's language.  Pt has a family member with her to translate information.

## 2015-02-16 NOTE — Progress Notes (Signed)
Patient ID: Molly Guerrero, female   DOB: 04/21/1971, 44 y.o.   MRN: [id number]  Chief Complaint  Patient presents with  . Follow-up    HPI Molly Guerrero is a 44 y.o. female.  Follow up from Molly Guerrero on 01/19/2015 for failed pregnancy @ 4753w5d. Pt reports taking cytotec and having large amount of bleeding afterwards. She states that she has not had any bleeding in 2 weeks. She reports burning with urination HPI  Past Medical History  Diagnosis Date  . UTI (urinary tract infection) during pregnancy     Past Surgical History  Procedure Laterality Date  . No past surgeries      Family History  Problem Relation Age of Onset  . Other Neg Hx     Social History History  Substance Use Topics  . Smoking status: Never Smoker   . Smokeless tobacco: Never Used  . Alcohol Use: No    No Known Allergies  Current Outpatient Prescriptions  Medication Sig Dispense Refill  . ibuprofen (ADVIL,MOTRIN) 800 MG tablet Take 1 tablet (800 mg total) by mouth 3 (three) times daily. 21 tablet 0  . promethazine (PHENERGAN) 25 MG tablet Take 1 tablet (25 mg total) by mouth every 6 (six) hours as needed for nausea or vomiting. 30 tablet 0  . promethazine (PHENERGAN) 25 MG tablet Take 1 tablet (25 mg total) by mouth every 6 (six) hours as needed for nausea. 30 tablet 0   No current facility-administered medications for this visit.    Review of Systems Review of Systems  Constitutional: Positive for fever.  Genitourinary: Positive for dysuria.  All other systems reviewed and are negative.   Blood pressure 116/79, pulse 67, temperature 99 F (37.2 C), weight 129 lb 8 oz (58.741 kg), not currently breastfeeding.  Physical Exam Physical Exam  Constitutional: She is oriented to person, place, and time. She appears well-developed and well-nourished. No distress.  HENT:  Head: Normocephalic.  Neck: Normal range of motion.  Cardiovascular: Normal rate.   Pulmonary/Chest: Effort normal. No respiratory distress.   Abdominal: Soft.  Musculoskeletal: Normal range of motion.  Neurological: She is alert and oriented to person, place, and time.  Skin: Skin is warm and dry.  Psychiatric: She has a normal mood and affect. Her behavior is normal. Judgment and thought content normal.  Nursing note and vitals reviewed.   Data Reviewed Molly Guerrero note from 01/19/15  Assessment    F/U from failed pregnancy     Plan    Repeat Beta HCG Quant Ua/  Urine cx       [name],[name] 02/16/2015, 4:10 PM

## 2015-02-17 LAB — URINE CULTURE: Colony Count: 15000

## 2015-02-17 LAB — HCG, QUANTITATIVE, PREGNANCY: hCG, Beta Chain, Quant, S: <2 m[IU]/mL

## 2015-02-19 ENCOUNTER — Telehealth: Payer: Self-pay | Admitting: *Deleted

## 2015-02-19 NOTE — Telephone Encounter (Signed)
Called [employer] and asked for [place] interpreter- none available at present.

## 2015-02-19 NOTE — Telephone Encounter (Signed)
Need to call patient per Cleda Mccreedy, CNM and tell her she does not need followup . Last bhcg less than 2.

## 2015-02-24 NOTE — Telephone Encounter (Addendum)
Attempted to call patient with [facility] interpreter . No [place] interpreter available at this time.   7/27  1035  Attempted to call pt again with [facility] interpreter however no [place] interpreter available at this time.  Letter will be sent to pt stating test results information and no need for additional follow up.  Diane Day RNC

## 2015-02-25 ENCOUNTER — Encounter: Payer: Self-pay | Admitting: *Deleted

## 2015-06-08 ENCOUNTER — Inpatient Hospital Stay (HOSPITAL_COMMUNITY)
Admission: AD | Admit: 2015-06-08 | Discharge: 2015-06-08 | Disposition: A | Discharge status: Home / Self Care | Payer: Self-pay | Source: Ambulatory Visit | Attending: Family Medicine | Admitting: Family Medicine

## 2015-06-08 ENCOUNTER — Inpatient Hospital Stay (HOSPITAL_COMMUNITY): Payer: Medicaid Other

## 2015-06-08 ENCOUNTER — Encounter (HOSPITAL_COMMUNITY): Payer: Self-pay | Admitting: *Deleted

## 2015-06-08 DIAGNOSIS — Z3A01 Less than 8 weeks gestation of pregnancy: Secondary | ICD-10-CM | POA: Insufficient documentation

## 2015-06-08 DIAGNOSIS — O418X10 Other specified disorders of amniotic fluid and membranes, first trimester, not applicable or unspecified: Secondary | ICD-10-CM

## 2015-06-08 DIAGNOSIS — O468X1 Other antepartum hemorrhage, first trimester: Secondary | ICD-10-CM

## 2015-06-08 DIAGNOSIS — R109 Unspecified abdominal pain: Secondary | ICD-10-CM | POA: Insufficient documentation

## 2015-06-08 DIAGNOSIS — O26891 Other specified pregnancy related conditions, first trimester: Secondary | ICD-10-CM | POA: Insufficient documentation

## 2015-06-08 DIAGNOSIS — O26899 Other specified pregnancy related conditions, unspecified trimester: Secondary | ICD-10-CM

## 2015-06-08 DIAGNOSIS — O43891 Other placental disorders, first trimester: Secondary | ICD-10-CM

## 2015-06-08 LAB — URINALYSIS, ROUTINE W REFLEX MICROSCOPIC
Bilirubin Urine: NEGATIVE
GLUCOSE, UA: NEGATIVE mg/dL
Ketones, ur: NEGATIVE mg/dL
NITRITE: NEGATIVE
PH: 5.5 (ref 5.0–8.0)
Protein, ur: NEGATIVE mg/dL
Specific Gravity, Urine: 1.01 (ref 1.005–1.030)
Urobilinogen, UA: 0.2 mg/dL (ref 0.0–1.0)

## 2015-06-08 LAB — CBC
HEMATOCRIT: 35.8 % — AB (ref 36.0–46.0)
Hemoglobin: 12.1 g/dL (ref 12.0–15.0)
MCH: 23 pg — ABNORMAL LOW (ref 26.0–34.0)
MCHC: 33.8 g/dL (ref 30.0–36.0)
MCV: 68.1 fL — ABNORMAL LOW (ref 78.0–100.0)
PLATELETS: 244 10*3/uL (ref 150–400)
RBC: 5.26 MIL/uL — ABNORMAL HIGH (ref 3.87–5.11)
RDW: 13.7 % (ref 11.5–15.5)
WBC: 5.7 10*3/uL (ref 4.0–10.5)

## 2015-06-08 LAB — URINE MICROSCOPIC-ADD ON

## 2015-06-08 LAB — POCT PREGNANCY, URINE: Preg Test, Ur: POSITIVE — AB

## 2015-06-08 LAB — ABO/RH: ABO/RH(D): O POS

## 2015-06-08 LAB — HCG, QUANTITATIVE, PREGNANCY: HCG, BETA CHAIN, QUANT, S: 8247 m[IU]/mL — AB (ref <5)

## 2015-06-08 NOTE — MAU Provider Note (Signed)
History     CSN: [id number]  Arrival date and time: 06/08/15 1631   First Provider Initiated Contact with Patient 06/08/15 1724      Chief Complaint  Patient presents with  . Abdominal Pain   HPI   Ms.Molly Guerrero is a 44 y.o. female [phone] at [redacted]w[redacted]d presenting to [name] with abdominal pain. The pain started 1-2 days ago ; the pain is on both sides of her abdomen. The pain feels light tightening and worsens when she moves.   She denies vaginal bleeding or discharge. She does not want to be tested for STI's.  History of thalassemia.   OB History    Gravida Para Term Preterm AB TAB SAB Ectopic Multiple Living   0 2 0 2 0 0 2      Past Medical History  Diagnosis Date  . UTI (urinary tract infection) during pregnancy     Past Surgical History  Procedure Laterality Date  . No past surgeries      Family History  Problem Relation Age of Onset  . Other Neg Hx     Social History  Substance Use Topics  . Smoking status: Never Smoker   . Smokeless tobacco: Never Used  . Alcohol Use: No    Allergies: No Known Allergies  Prescriptions prior to admission  Medication Sig Dispense Refill Last Dose  . ibuprofen (ADVIL,MOTRIN) 800 MG tablet Take 1 tablet (800 mg total) by mouth 3 (three) times daily. 21 tablet 0 Taking  . promethazine (PHENERGAN) 25 MG tablet Take 1 tablet (25 mg total) by mouth every 6 (six) hours as needed for nausea. 30 tablet 0   . promethazine (PHENERGAN) 25 MG tablet Take 1 tablet (25 mg total) by mouth every 6 (six) hours as needed for nausea or vomiting. 30 tablet 0 Taking   Results for orders placed or performed during the hospital encounter of 06/08/15 (from the past 48 hour(s))  Urinalysis, Routine w reflex microscopic (not at [facility])     Status: Abnormal   Collection Time: 06/08/15  4:45 PM  Result Value Ref Range   Color, Urine YELLOW YELLOW   APPearance CLEAR CLEAR   Specific Gravity, Urine 1.010 1.005 - 1.030   pH 5.5 5.0 - 8.0   Glucose,  UA NEGATIVE NEGATIVE mg/dL   Hgb urine dipstick MODERATE (A) NEGATIVE   Bilirubin Urine NEGATIVE NEGATIVE   Ketones, ur NEGATIVE NEGATIVE mg/dL   Protein, ur NEGATIVE NEGATIVE mg/dL   Urobilinogen, UA 0.2 0.0 - 1.0 mg/dL   Nitrite NEGATIVE NEGATIVE   Leukocytes, UA SMALL (A) NEGATIVE  Urine microscopic-add on     Status: Abnormal   Collection Time: 06/08/15  4:45 PM  Result Value Ref Range   Squamous Epithelial / LPF FEW (A) RARE   WBC, UA 0-2 <3 WBC/hpf   RBC / HPF 0-2 <3 RBC/hpf   Bacteria, UA RARE RARE  Pregnancy, urine POC     Status: Abnormal   Collection Time: 06/08/15  5:01 PM  Result Value Ref Range   Preg Test, Ur POSITIVE (A) NEGATIVE    Comment:        THE SENSITIVITY OF THIS METHODOLOGY IS >24 mIU/mL   CBC     Status: Abnormal   Collection Time: 06/08/15  5:50 PM  Result Value Ref Range   WBC 5.7 4.0 - 10.5 K/uL   RBC 5.26 (H) 3.87 - 5.11 MIL/uL   Hemoglobin 12.1 12.0 - 15.0 g/dL   HCT 25.3 (  L) 36.0 - 46.0 %   MCV 68.1 (L) 78.0 - 100.0 fL    Comment: REPEATED TO VERIFY   MCH 23.0 (L) 26.0 - 34.0 pg   MCHC 33.8 30.0 - 36.0 g/dL   RDW 16.113.7 09.611.5 - 04.515.5 %   Platelets 244 150 - 400 K/uL  ABO/Rh     Status: None   Collection Time: 06/08/15  5:50 PM  Result Value Ref Range   ABO/RH(D) O POS   hCG, quantitative, pregnancy     Status: Abnormal   Collection Time: 06/08/15  5:50 PM  Result Value Ref Range   hCG, Beta Chain, Quant, S 8247 (H) <5 mIU/mL    Comment:          GEST. AGE      CONC.  (mIU/mL)   <=1 WEEK        5 - 50     2 WEEKS       50 - 500     3 WEEKS       100 - 10,000     4 WEEKS     1,000 - 30,000     5 WEEKS     3,500 - 115,000   6-8 WEEKS     12,000 - 270,000    12 WEEKS     15,000 - 220,000        FEMALE AND NON-PREGNANT FEMALE:     LESS THAN 5 mIU/mL   [place] Ob Comp Less 14 Wks  06/08/2015  CLINICAL DATA:  Pregnant patient with abdominal pain. EXAM: OBSTETRIC <14 WK US AND TRANSVAGINAL OB US TECHNIQUE: Both transabdominal and transvaginal  ultrasound examinations were performed for complete evaluation of the gestation as well as the maternal uterus, adnexal regions, and pelvic cul-de-sac. Transvaginal technique was performed to assess early pregnancy. COMPARISON:  None. FINDINGS: Intrauterine gestational sac: Visualized/normal in shape. Small subchorionic hemorrhage is noted. Yolk sac:  Visualized. Embryo:  Visualized. Cardiac Activity: Detected. Heart Rate: 76  bpm CRL:  3.9  mm   6 w   1 d                  US EDC: 01/31/2016 Maternal uterus/adnexae: The left ovary is not visualized due to overlying bowel gas. The right ovary appears normal. No free pelvic fluid. IMPRESSION: Single living intrauterine pregnancy. Small subchorionic hemorrhage is noted. Electronically Signed   By: Drusilla Kannerhomas  Dalessio M.D.   On: 06/08/2015 19:06   [place] Ob Transvaginal  06/08/2015  CLINICAL DATA:  Pregnant patient with abdominal pain. EXAM: OBSTETRIC <14 WK US AND TRANSVAGINAL OB US TECHNIQUE: Both transabdominal and transvaginal ultrasound examinations were performed for complete evaluation of the gestation as well as the maternal uterus, adnexal regions, and pelvic cul-de-sac. Transvaginal technique was performed to assess early pregnancy. COMPARISON:  None. FINDINGS: Intrauterine gestational sac: Visualized/normal in shape. Small subchorionic hemorrhage is noted. Yolk sac:  Visualized. Embryo:  Visualized. Cardiac Activity: Detected. Heart Rate: 76  bpm CRL:  3.9  mm   6 w   1 d                  US EDC: 01/31/2016 Maternal uterus/adnexae: The left ovary is not visualized due to overlying bowel gas. The right ovary appears normal. No free pelvic fluid. IMPRESSION: Single living intrauterine pregnancy. Small subchorionic hemorrhage is noted. Electronically Signed   By: Drusilla Kannerhomas  Dalessio M.D.   On: 06/08/2015 19:06    Review of Systems  Constitutional: Negative for  fever.  Gastrointestinal: Positive for nausea and abdominal pain. Negative for vomiting, diarrhea and  constipation.  Genitourinary: Positive for dysuria.   Physical Exam   Blood pressure 108/64, pulse 65, temperature 98.3 F (36.8 C), temperature source Oral, resp. rate 16, height 5' 0.5" (1.537 m), weight 138 lb 4 oz (62.71 kg), last menstrual period 05/10/2015.  Physical Exam  Constitutional: She is oriented to person, place, and time. She appears well-developed and well-nourished. No distress.  HENT:  Head: Normocephalic.  Eyes: Pupils are equal, round, and reactive to light.  GI: Normal appearance. There is generalized tenderness. There is no rigidity, no guarding and no CVA tenderness.  Musculoskeletal: Normal range of motion.  Neurological: She is alert and oriented to person, place, and time.  Skin: Skin is warm. She is not diaphoretic.    [name] Course  Procedures  None  MDM  CBC ABO Hcg [place].   Assessment and Plan   A:  1. Abdominal pain in pregnancy, antepartum   2. Subchorionic hematoma in first trimester    P:  Discharge home in stable condition Start prenatal care Return to [name] for worsening symptoms Health department information given First trimester warning signs   Duane Lope, NP 06/08/2015 5:41 PM

## 2015-06-08 NOTE — MAU Note (Signed)
Patient presents stating she is [redacted] weeks gestation via +HPT with c/o abdominal pain. Denies bleeding or discharge.

## 2015-06-08 NOTE — Discharge Instructions (Signed)
Subchorionic Hematoma °A subchorionic hematoma is a gathering of blood between the outer wall of the placenta and the inner wall of the womb (uterus). The placenta is the organ that connects the fetus to the wall of the uterus. The placenta performs the feeding, breathing (oxygen to the fetus), and waste removal (excretory work) of the fetus.  °Subchorionic hematoma is the most common abnormality found on a result from ultrasonography done during the first trimester or early second trimester of pregnancy. If there has been little or no vaginal bleeding, early small hematomas usually shrink on their own and do not affect your baby or pregnancy. The blood is gradually absorbed over 1-2 weeks. When bleeding starts later in pregnancy or the hematoma is larger or occurs in an older pregnant woman, the outcome may not be as good. Larger hematomas may get bigger, which increases the chances for miscarriage. Subchorionic hematoma also increases the risk of premature detachment of the placenta from the uterus, preterm (premature) labor, and stillbirth. °HOME CARE INSTRUCTIONS °· Stay on bed rest if your health care provider recommends this. Although bed rest will not prevent more bleeding or prevent a miscarriage, your health care provider may recommend bed rest until you are advised otherwise. °· Avoid heavy lifting (more than 10 lb [4.5 kg]), exercise, sexual intercourse, or douching as directed by your health care provider. °· Keep track of the number of pads you use each day and how soaked (saturated) they are. Write down this information. °· Do not use tampons. °· Keep all follow-up appointments as directed by your health care provider. Your health care provider may ask you to have follow-up blood tests or ultrasound tests or both. °SEEK IMMEDIATE MEDICAL CARE IF: °· You have severe cramps in your stomach, back, abdomen, or pelvis. °· You have a fever. °· You pass large clots or tissue. Save any tissue for your health  care provider to look at. °· Your bleeding increases or you become lightheaded, feel weak, or have fainting episodes. °  °This information is not intended to replace advice given to you by your health care provider. Make sure you discuss any questions you have with your health care provider. °  °Document Released: 11/02/2006 Document Revised: 08/08/2014 Document Reviewed: 02/14/2013 °Elsevier Interactive Patient Education ©2016 [employer] ° °

## 2015-06-09 LAB — HIV ANTIBODY (ROUTINE TESTING W REFLEX): HIV Screen 4th Generation wRfx: NONREACTIVE

## 2015-06-14 ENCOUNTER — Inpatient Hospital Stay (HOSPITAL_COMMUNITY)
Admission: RE | Admit: 2015-06-14 | Discharge: 2015-06-14 | Disposition: A | Discharge status: Home / Self Care | Payer: Self-pay | Source: Ambulatory Visit | Attending: Obstetrics & Gynecology | Admitting: Obstetrics & Gynecology

## 2015-06-14 ENCOUNTER — Inpatient Hospital Stay (HOSPITAL_COMMUNITY): Payer: Medicaid Other

## 2015-06-14 ENCOUNTER — Encounter (HOSPITAL_COMMUNITY): Payer: Self-pay | Admitting: *Deleted

## 2015-06-14 DIAGNOSIS — O200 Threatened abortion: Secondary | ICD-10-CM | POA: Insufficient documentation

## 2015-06-14 DIAGNOSIS — O021 Missed abortion: Secondary | ICD-10-CM

## 2015-06-14 DIAGNOSIS — Z3A01 Less than 8 weeks gestation of pregnancy: Secondary | ICD-10-CM | POA: Insufficient documentation

## 2015-06-14 MED ORDER — OXYCODONE-ACETAMINOPHEN 5-325 MG PO TABS: 1.0000 | ORAL_TABLET | ORAL | Status: AC | PRN

## 2015-06-14 NOTE — MAU Provider Note (Signed)
Molly Guerrero is a 44 y.o. [id number] at 2590w0d presenting vaginal bleeding. Montagnard interpreter here with patient.   Vaginal Bleeding The patient's primary symptoms include vaginal bleeding. This is a new problem. The current episode started yesterday. The problem occurs constantly (Yesterday indicates constant but 1 pad only. Today less). The problem has been gradually improving. The pain is mild. The problem affects the right side. She is pregnant. Associated symptoms include abdominal pain. Pertinent negatives include no diarrhea, dysuria or frequency. Associated symptoms comments: Crampy suprapubic pain . The vaginal discharge was bloody. The vaginal bleeding is lighter than menses. She has not been passing clots. She has not been passing tissue. Nothing aggravates the symptoms. She has tried nothing for the symptoms. She is not sexually active. No, her partner does not have an STD.   Seen in [name] 06/08/15 with abd pain in pregancy and had US showing viable IUP c/w 5973w1d with embryonic HR 76, small SCH.  Blood type O pos.  OB History  Gravida Para Term Preterm AB SAB TAB Ectopic Multiple Living  5 2 2  0 2 2 0 0 0 2    # Outcome Date GA Lbr Len/2nd Weight Sex Delivery Anes PTL Lv  5 Current           4 Term 12/01/11 2018w1d 16:17 / 00:24 4 lb 8.7 oz (2.06 kg) M Vag-Spont Local  Y  3 SAB           2 SAB           1 Term              Past Medical History  Diagnosis Date  . UTI (urinary tract infection) during pregnancy    Past Surgical History  Procedure Laterality Date  . No past surgeries       Physical Exam  Constitutional: She is oriented to person, place, and time and well-developed, well-nourished, and in no distress. No distress.  HENT:  Head: Normocephalic.  Neck: Normal range of motion.  Cardiovascular: Normal rate.   Pulmonary/Chest: Effort normal.  Abdominal: Soft. There is no tenderness. There is no guarding.  Genitourinary: Uterus normal and cervix normal.  Small amount  red blood in vagina. Int os closed  Musculoskeletal: Normal range of motion.  Neurological: She is alert and oriented to person, place, and time.  Skin: Skin is warm and dry.  Nursing note and vitals reviewed.  Filed Vitals:   06/14/15 1423  BP: 108/62  Pulse: 67  Temp: 97.4 F (36.3 C)  Resp: 18   [place] Ob Transvaginal  06/14/2015  CLINICAL DATA:  Vaginal bleeding. Pregnant patient. Patient is [redacted] weeks pregnant based on her last ultrasound. EXAM: TRANSVAGINAL OB ULTRASOUND TECHNIQUE: Transvaginal ultrasound was performed for complete evaluation of the gestation as well as the maternal uterus, adnexal regions, and pelvic cul-de-sac. COMPARISON:  06/08/2015 FINDINGS: Intrauterine gestational sac: Visualized/normal in shape. Yolk sac:  Yes Embryo:  Yes Cardiac Activity: No Heart Rate: Not detectable CRL:   6  mm   6 w 3 d Maternal uterus/adnexae: No uterine masses. No subchronic hemorrhage. No endometrial fluid. Unremarkable ovaries. No adnexal masses. No pelvic free fluid. IMPRESSION: There is an intrauterine gestational sac, embryo and yolk sac, but there is now no detectable fetal heart activity, which was present on the prior ultrasound. Findings consistent with a failed intrauterine pregnancy. Electronically Signed   By: Amie Portlandavid  Ormond M.D.   On: 06/14/2015 15:37   ASSESSMENT:  1. Embryonic demise  2. Threatened miscarriage in early pregnancy    PLAN: Counseled at length on option of Cytotec termination or expectant management. She voices understanding and elects expectant management. She will return for heavy or unrelenting bleeding, orthostatic symptoms or severe pain.    Medication List    TAKE these medications        oxyCODONE-acetaminophen 5-325 MG tablet  Commonly known as:  PERCOCET/ROXICET  Take 1 tablet by mouth every 4 (four) hours as needed.       Follow-up Information    Follow up with [facility] In 2 weeks.   Specialty:  Obstetrics and Gynecology    Why:  Someone from Clinic will call you with appt.   Contact information:   [address] [place] [address] [phone]

## 2015-06-14 NOTE — Discharge Instructions (Signed)

## 2015-06-14 NOTE — MAU Note (Signed)
Increased vaginal bleeding yesterday.  It has slowed but she has pain on lower right side, pain is worse when she is bleeding and gets better when the bleeding slows down, some little clots but mostly a trickle of blood.

## 2015-06-17 ENCOUNTER — Encounter (HOSPITAL_COMMUNITY): Payer: Self-pay | Admitting: *Deleted

## 2015-06-17 ENCOUNTER — Inpatient Hospital Stay (HOSPITAL_COMMUNITY)
Admission: AD | Admit: 2015-06-17 | Discharge: 2015-06-17 | Disposition: A | Discharge status: Home / Self Care | Payer: Self-pay | Source: Ambulatory Visit | Attending: Obstetrics & Gynecology | Admitting: Obstetrics & Gynecology

## 2015-06-17 DIAGNOSIS — O039 Complete or unspecified spontaneous abortion without complication: Secondary | ICD-10-CM | POA: Insufficient documentation

## 2015-06-17 DIAGNOSIS — Z3A01 Less than 8 weeks gestation of pregnancy: Secondary | ICD-10-CM | POA: Insufficient documentation

## 2015-06-17 LAB — CBC
HEMATOCRIT: 38.2 % (ref 36.0–46.0)
Hemoglobin: 12.6 g/dL (ref 12.0–15.0)
MCH: 22.7 pg — ABNORMAL LOW (ref 26.0–34.0)
MCHC: 33 g/dL (ref 30.0–36.0)
MCV: 68.8 fL — ABNORMAL LOW (ref 78.0–100.0)
Platelets: 271 10*3/uL (ref 150–400)
RBC: 5.55 MIL/uL — AB (ref 3.87–5.11)
RDW: 13.7 % (ref 11.5–15.5)
WBC: 6.4 10*3/uL (ref 4.0–10.5)

## 2015-06-17 NOTE — Discharge Instructions (Signed)
Miscarriage °A miscarriage is the loss of an unborn baby (fetus) before the 20th week of pregnancy. The cause is often unknown.  °HOME CARE °· You may need to stay in bed (bed rest), or you may be able to do light activity. Go about activity as told by your doctor. °· Have help at home. °· Write down how many pads you use each day. Write down how soaked they are. °· Do not use tampons. Do not wash out your vagina (douche) or have sex (intercourse) until your doctor approves. °· Only take medicine as told by your doctor. °· Do not take aspirin. °· Keep all doctor visits as told. °· If you or your partner have problems with grieving, talk to your doctor. You can also try counseling. Give yourself time to grieve before trying to get pregnant again. °GET HELP RIGHT AWAY IF: °· You have bad cramps or pain in your back or belly (abdomen). °· You have a fever. °· You pass large clumps of blood (clots) from your vagina that are walnut-sized or larger. Save the clumps for your doctor to see. °· You pass large amounts of tissue from your vagina. Save the tissue for your doctor to see. °· You have more bleeding. °· You have thick, bad-smelling fluid (discharge) coming from the vagina. °· You get lightheaded, weak, or you pass out (faint). °· You have chills. °MAKE SURE YOU: °· Understand these instructions. °· Will watch your condition. °· Will get help right away if you are not doing well or get worse. °  °This information is not intended to replace advice given to you by your health care provider. Make sure you discuss any questions you have with your health care provider. °  °Document Released: 10/10/2011 Document Reviewed: 10/10/2011 °Elsevier Interactive Patient Education ©2016 [employer] ° °

## 2015-06-17 NOTE — MAU Note (Signed)
Pt dx'd with embryonic demise last week, C/O abd & back pain that is more intense today, also heavy bleeding & passing clots.

## 2015-06-17 NOTE — MAU Provider Note (Signed)
History     CSN: [id number]  Arrival date and time: 06/17/15 1702   First Provider Initiated Contact with Patient 06/17/15 1945      Chief Complaint  Patient presents with  . Vaginal Bleeding  . Abdominal Pain  . Back Pain   HPI [name] is a 44 y.o. [id number] at 36447w3d who presents to [name] today with complaint of vaginal bleeding and lower abdominal pain today. She states that she has passed some large clots. She was diagnosed with a failed IUP at 6047w3d on 06/14/15. She chose expectant management at that time. She was not bleeding then. She states pain is rated at 3/10 now. She feels dizzy occasionally. She is taking Percocet for pain.    OB History    Gravida Para Term Preterm AB TAB SAB Ectopic Multiple Living   5 2 2  0 2 0 2 0 0 2      Past Medical History  Diagnosis Date  . UTI (urinary tract infection) during pregnancy     Past Surgical History  Procedure Laterality Date  . No past surgeries      Family History  Problem Relation Age of Onset  . Other Neg Hx     Social History  Substance Use Topics  . Smoking status: Never Smoker   . Smokeless tobacco: Never Used  . Alcohol Use: No    Allergies: No Known Allergies  Prescriptions prior to admission  Medication Sig Dispense Refill Last Dose  . oxyCODONE-acetaminophen (PERCOCET/ROXICET) 5-325 MG tablet Take 1 tablet by mouth every 4 (four) hours as needed. 12 tablet 0 More than a month at Unknown time    Review of Systems  Constitutional: Negative for fever and malaise/fatigue.  Gastrointestinal: Positive for abdominal pain.  Genitourinary:       + vaginal bleeding   Physical Exam   Blood pressure 125/64, pulse 69, temperature 98 F (36.7 C), temperature source Oral, resp. rate 18, last menstrual period 05/10/2015.  Physical Exam  Nursing note and vitals reviewed. Constitutional: She is oriented to person, place, and time. She appears well-developed and well-nourished. No distress.  HENT:  Head:  Normocephalic and atraumatic.  Cardiovascular: Normal rate.   Respiratory: Effort normal.  GI: Soft. She exhibits no distension and no mass. There is no tenderness. There is no rebound and no guarding.  Genitourinary: There is bleeding (small blood in vaginal vault) in the vagina. No vaginal discharge found.  Neurological: She is alert and oriented to person, place, and time.  Skin: Skin is warm and dry. No erythema.  Psychiatric: She has a normal mood and affect.    Results for orders placed or performed during the hospital encounter of 06/17/15 (from the past 24 hour(s))  CBC     Status: Abnormal   Collection Time: 06/17/15  5:58 PM  Result Value Ref Range   WBC 6.4 4.0 - 10.5 K/uL   RBC 5.55 (H) 3.87 - 5.11 MIL/uL   Hemoglobin 12.6 12.0 - 15.0 g/dL   HCT 14.738.2 82.936.0 - 56.246.0 %   MCV 68.8 (L) 78.0 - 100.0 fL   MCH 22.7 (L) 26.0 - 34.0 pg   MCHC 33.0 30.0 - 36.0 g/dL   RDW 13.013.7 86.511.5 - 78.415.5 %   Platelets 271 150 - 400 K/uL    [name] Course  Procedures None  MDM CBC today is stable Patient is hemodynamically stable today Assessment and Plan  A: SAB in progress  P: Discharge home Continue Percocet PRN for  pain Bleeding precautions discussed Patient advised to follow-up with [facility] as planned for follow-up after SAB in 2 weeks Patient may return to [name] as needed or if her condition were to change or worsen   Molly Lowenstein, PA-C  06/17/2015, 8:41 PM

## 2015-07-01 ENCOUNTER — Encounter: Payer: Self-pay | Admitting: General Practice

## 2015-07-01 ENCOUNTER — Encounter: Payer: Self-pay | Admitting: Physician Assistant

## 2015-07-01 NOTE — Progress Notes (Signed)
Patient came by clinic today for appt but did not want to stay due to financial cost. Patient speaks Estanislado Spirejarai and a friend was present for interpretation. Recommended patient at least have bchg to make sure levels were decreasing appropriately. Patient denied labs due to cost and I recommended she at least go to HD for follow up. She verbalized understanding and had no questions

## 2016-04-12 ENCOUNTER — Encounter (HOSPITAL_COMMUNITY): Payer: Self-pay
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
## 2017-01-02 IMAGING — US US OB COMP LESS 14 WK
1 series · 15 of 28 positions shown · non-contrast
Comparison: None.

CLINICAL DATA: Pregnant patient with abdominal pain.

EXAM:
OBSTETRIC <14 WK US AND TRANSVAGINAL OB US
TECHNIQUE: Both transabdominal and transvaginal ultrasound examinations were
performed for complete evaluation of the gestation as well as the
maternal uterus, adnexal regions, and pelvic cul-de-sac.
Transvaginal technique was performed to assess early pregnancy.

[Series 1: us ob comp less 14 wk · 61 acquisitions, 15 frames shown]
[im 1/61]
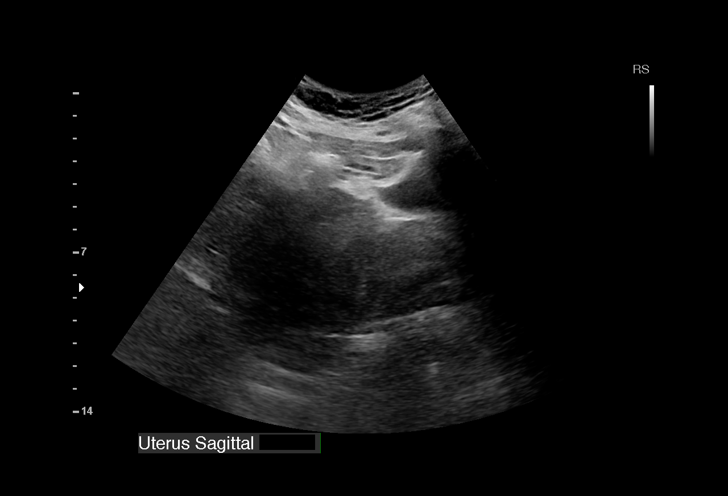
[im 5/61]
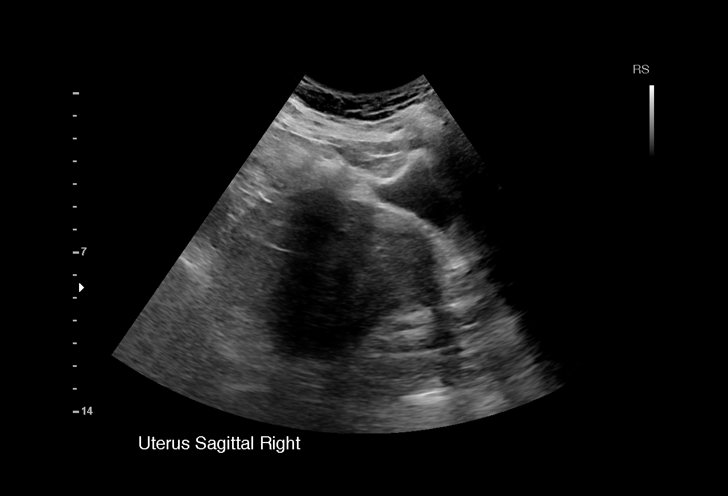
[im 9/61]
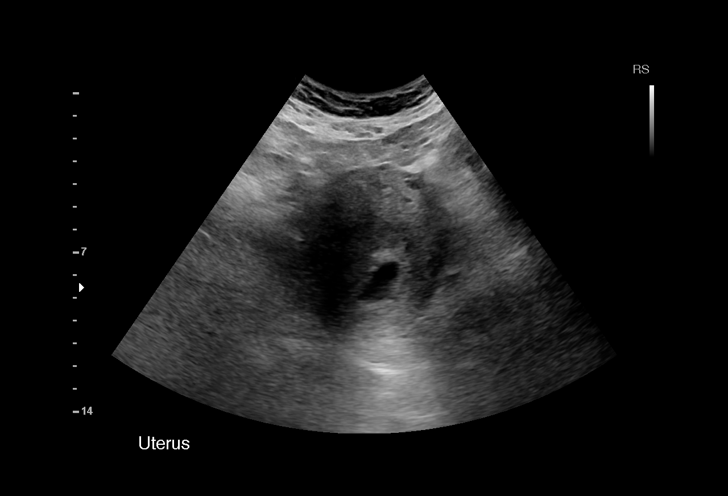
[im 14/61]
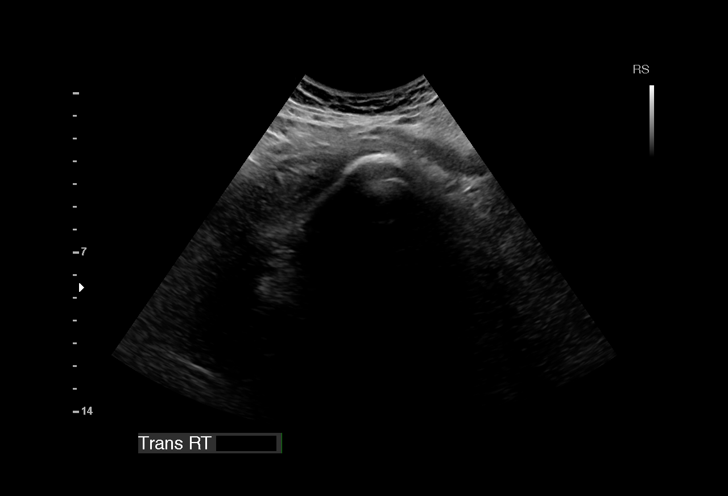
[im 18/61]
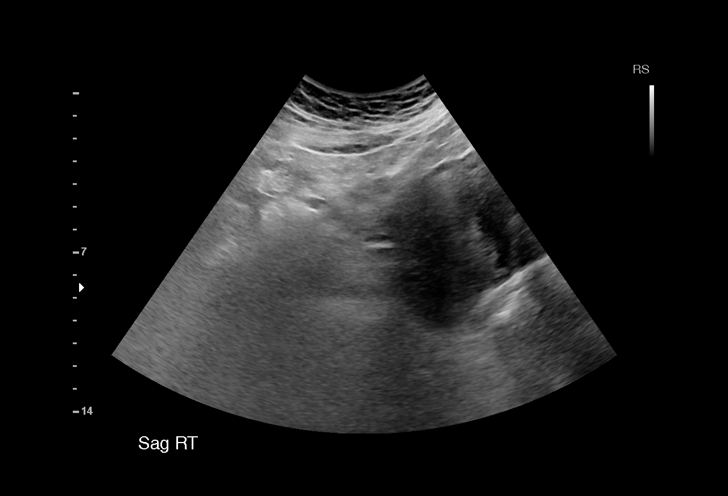
[im 23/61]
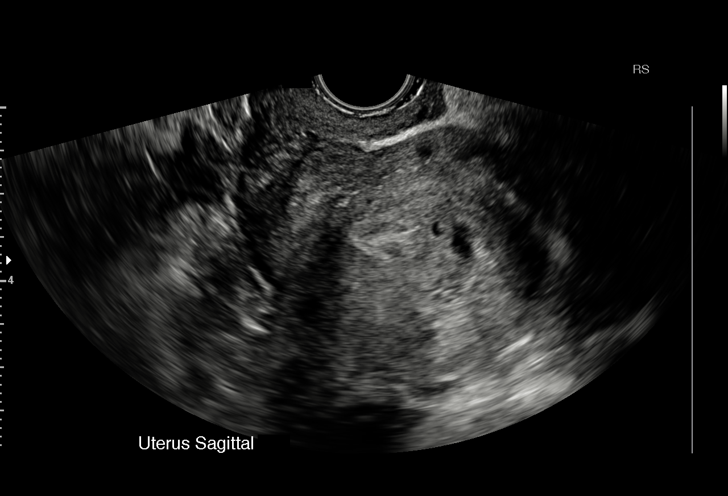
[im 27/61]
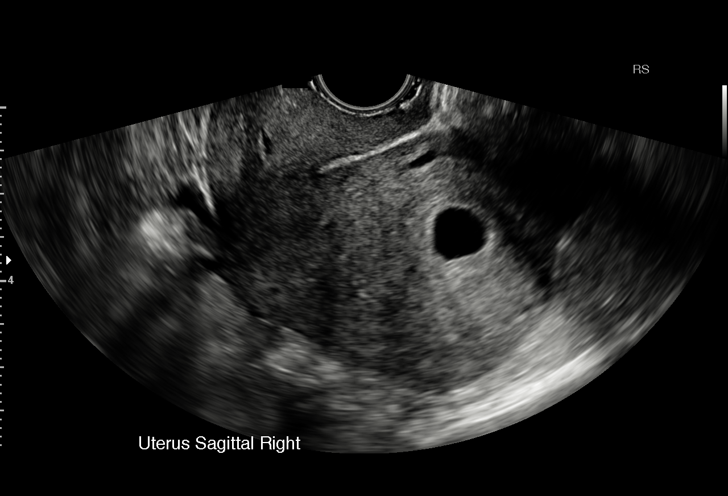
[im 32/61]
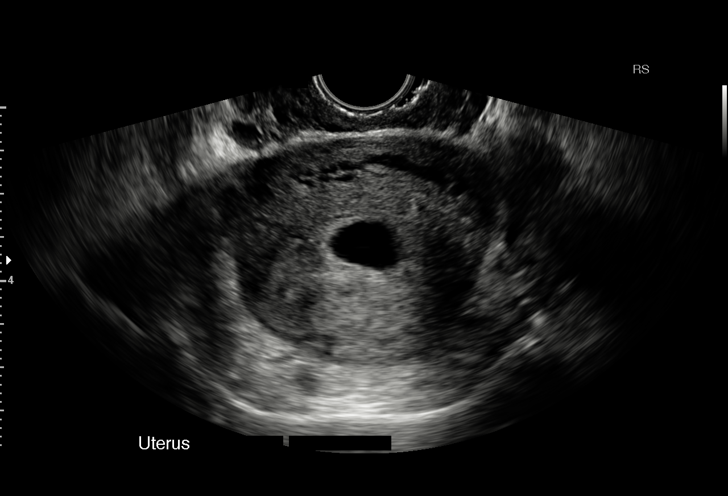
[im 34/61]
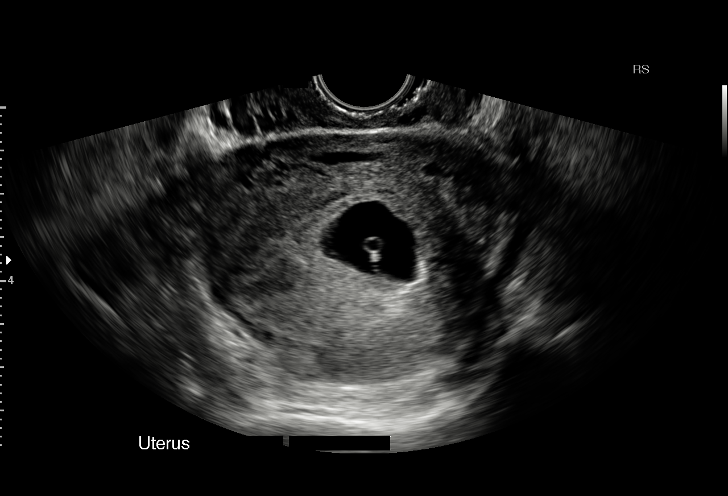
[im 38/61]
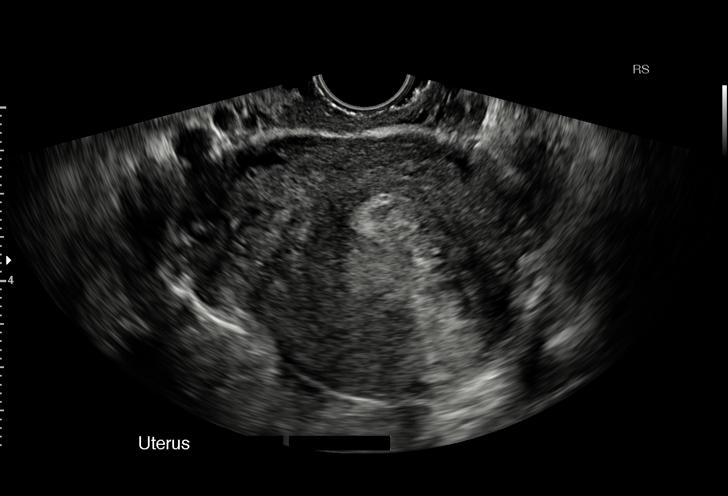
[im 43/61]
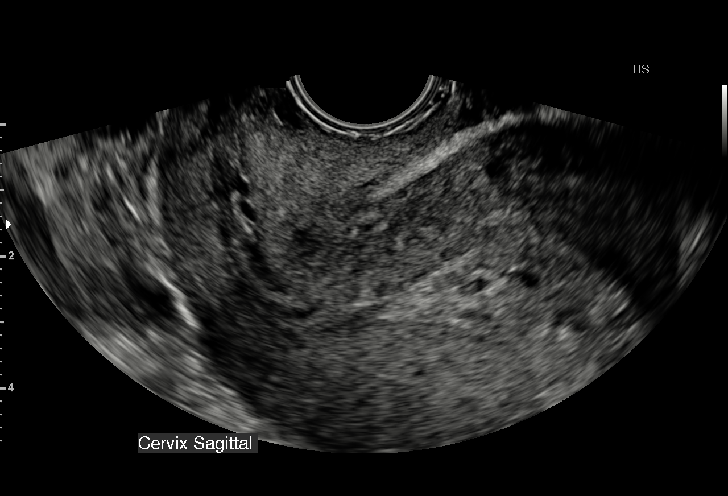
[im 47/61]
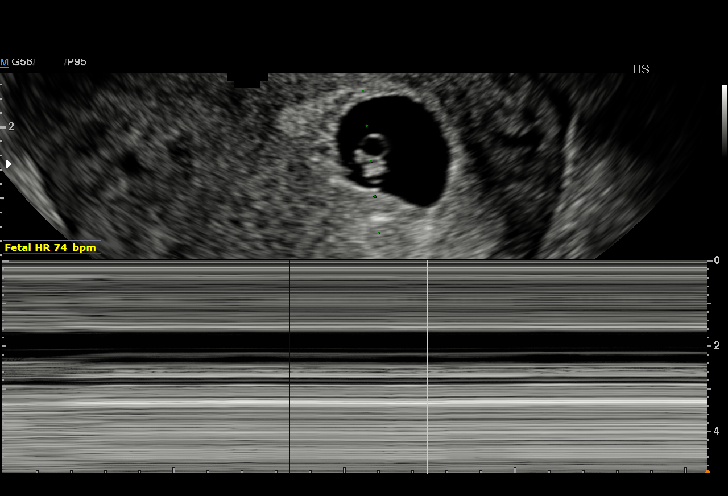
[im 52/61]
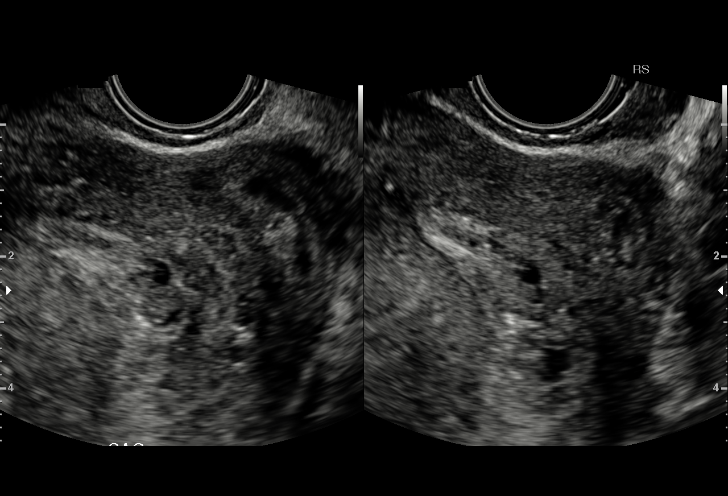
[im 56/61]
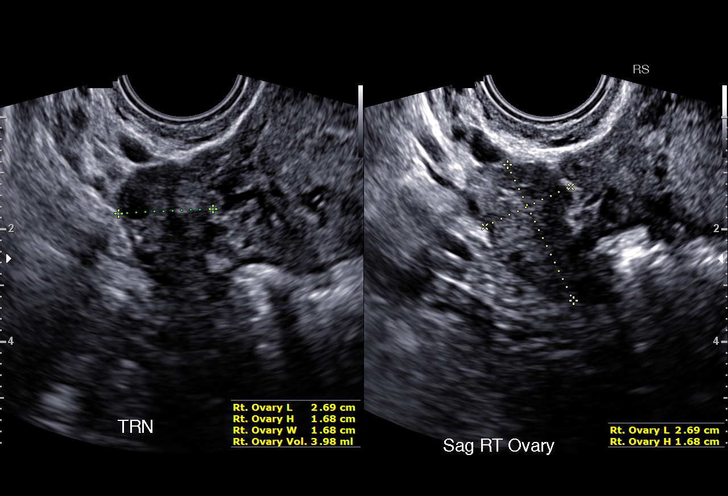
[im 61/61]
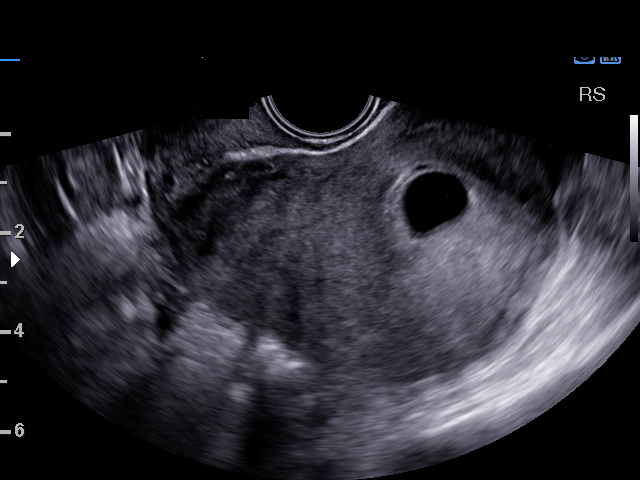

[15 of 28 positions shown; findings below may reference images not displayed]

FINDINGS: Intrauterine gestational sac: Visualized/normal in shape. Small
subchorionic hemorrhage is noted.

Yolk sac:  Visualized.

Embryo:  Visualized.

Cardiac Activity: Detected.

Heart Rate: 76  bpm

CRL:  3.9  mm   6 w   1 d                  US EDC: 01/31/2016

Maternal uterus/adnexae: The left ovary is not visualized due to
overlying bowel gas. The right ovary appears normal. No free pelvic
fluid.
IMPRESSION: Single living intrauterine pregnancy. Small subchorionic hemorrhage
is noted.

## 2017-01-08 IMAGING — US US OB TRANSVAGINAL
1 series · 15 of 25 positions shown · non-contrast
Comparison: 06/08/2015

CLINICAL DATA: Vaginal bleeding. Pregnant patient. Patient is 7
weeks pregnant based on her last ultrasound.

EXAM:
TRANSVAGINAL OB ULTRASOUND
TECHNIQUE: Transvaginal ultrasound was performed for complete evaluation of the
gestation as well as the maternal uterus, adnexal regions, and
pelvic cul-de-sac.

[Series 1: us ob transvaginal · 25 acquisitions, 15 frames shown]
[im 1/25]
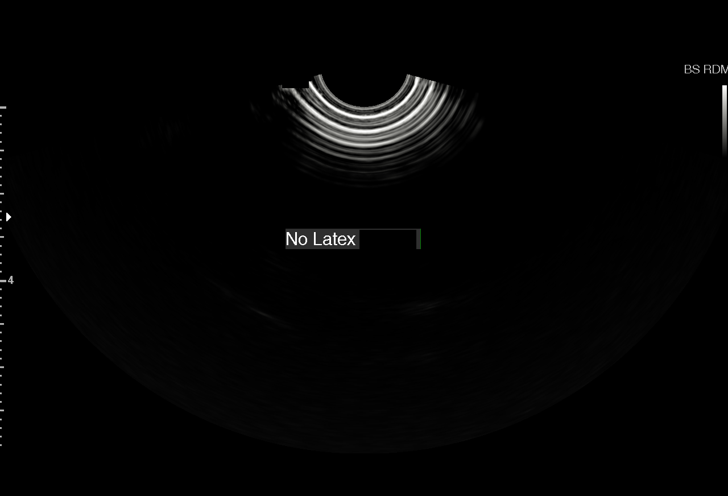
[im 3/25]
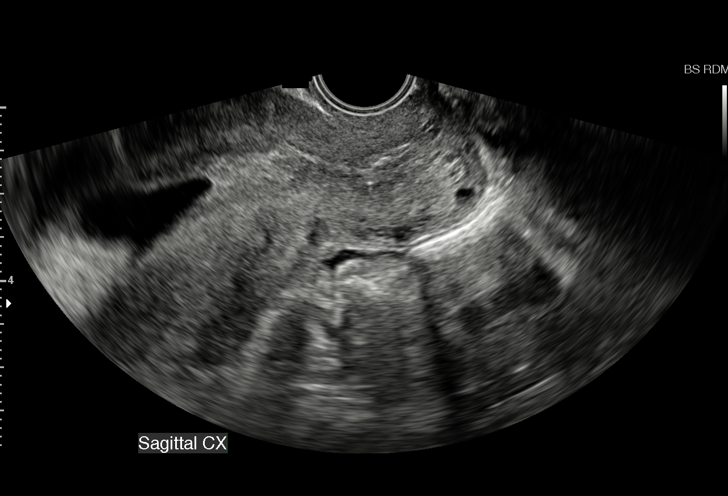
[im 5/25]
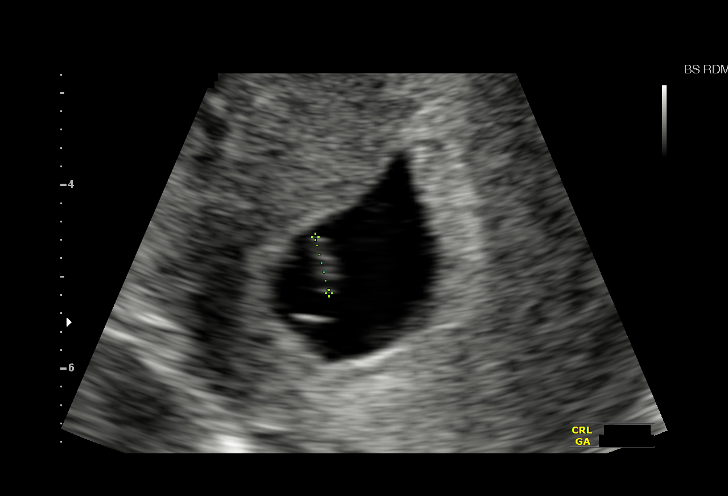
[im 6/25]
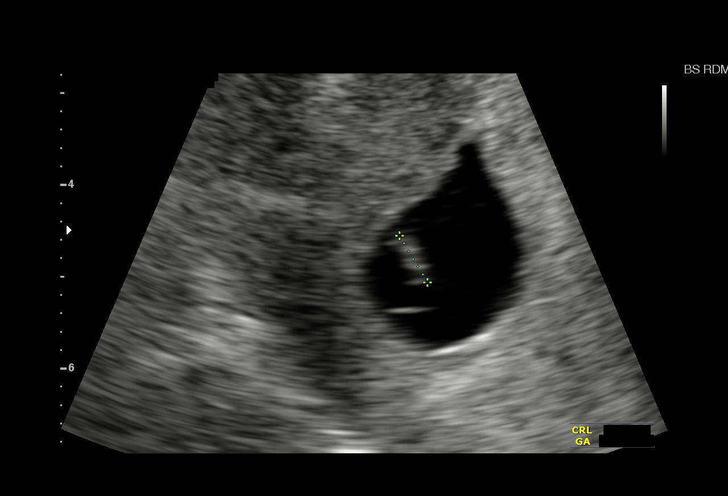
[im 8/25]
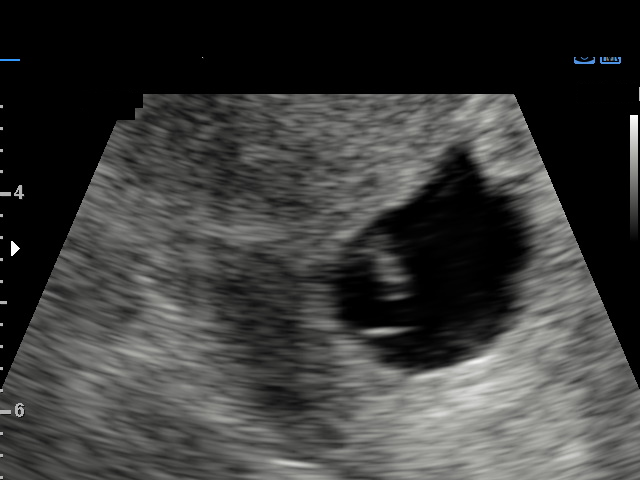
[im 10/25]
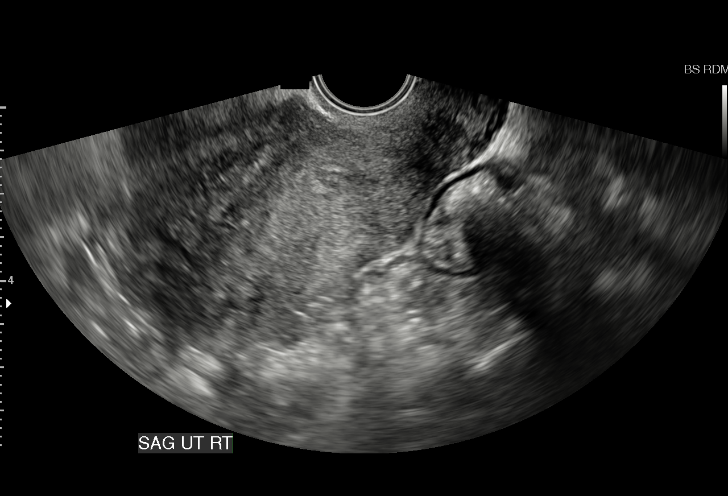
[im 11/25]
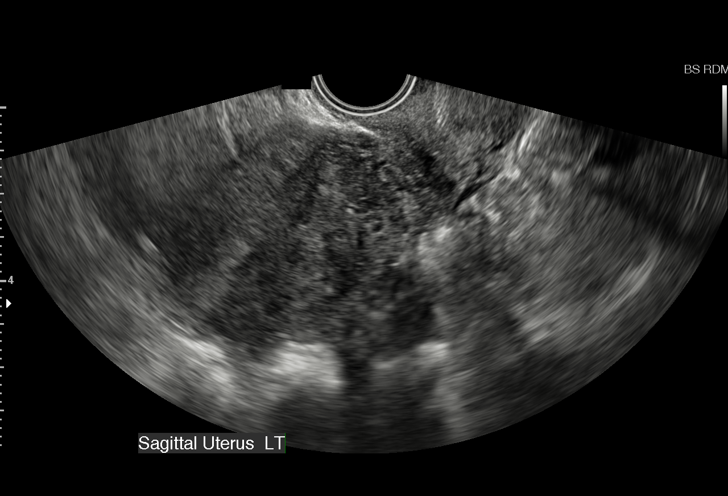
[im 13/25]
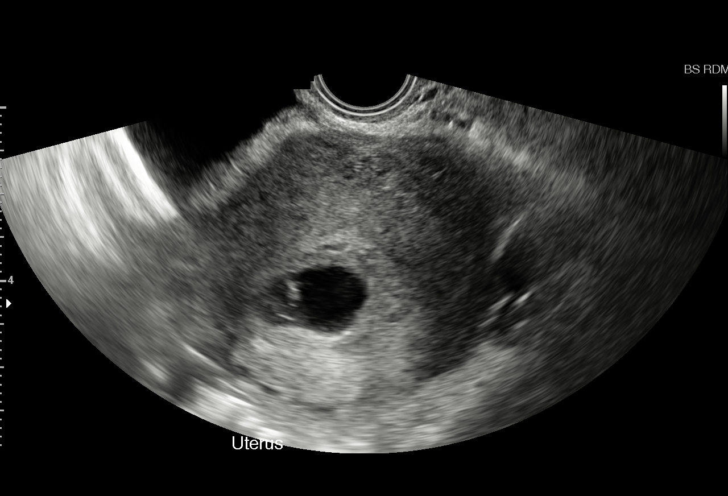
[im 15/25]
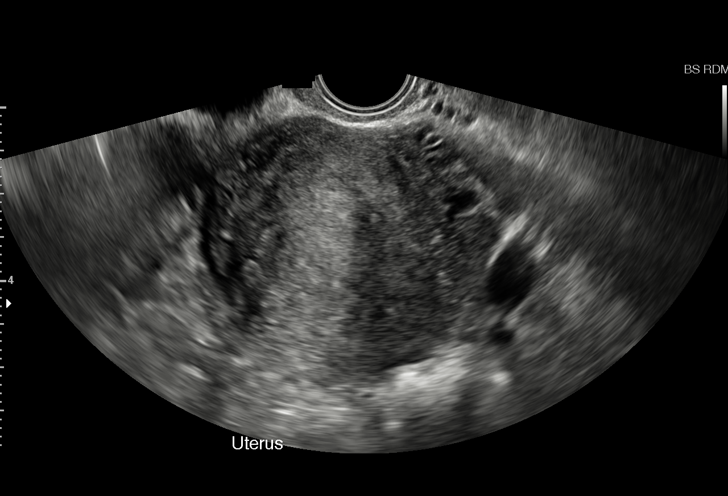
[im 16/25]
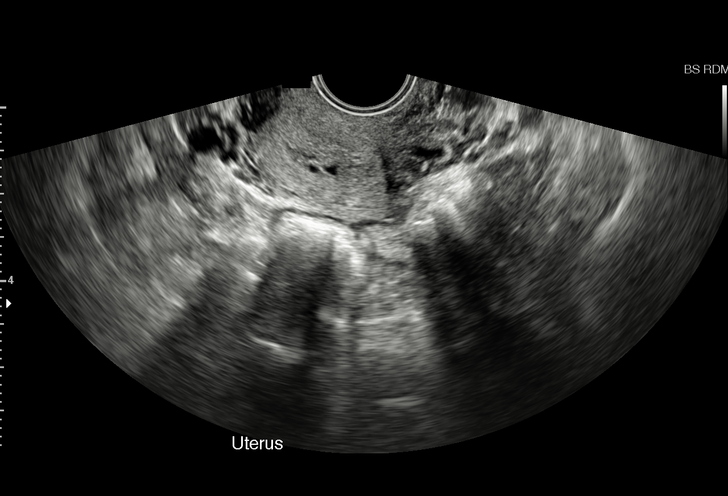
[im 18/25]
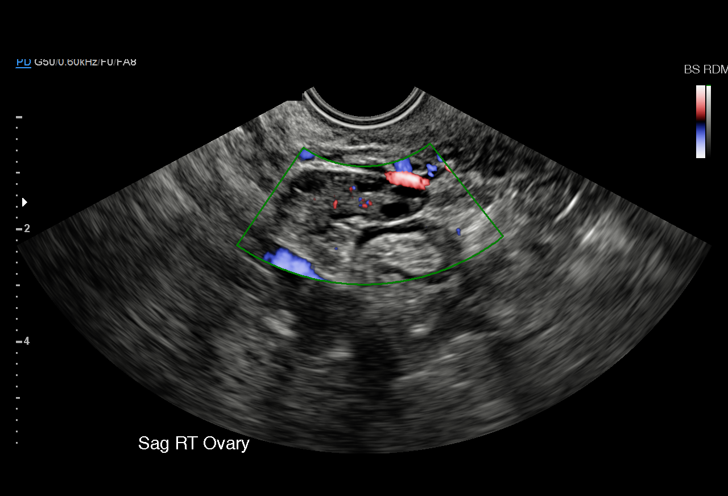
[im 20/25]
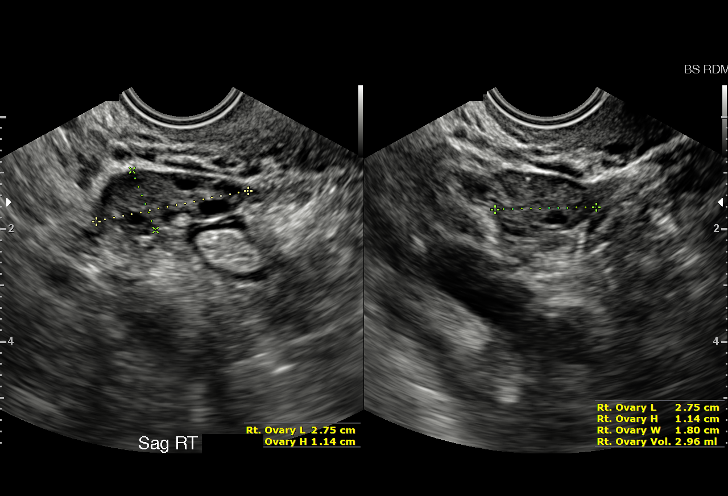
[im 21/25]
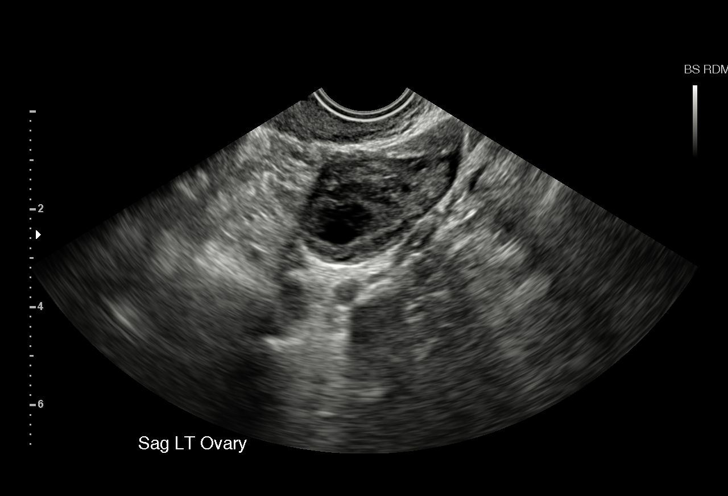
[im 23/25]
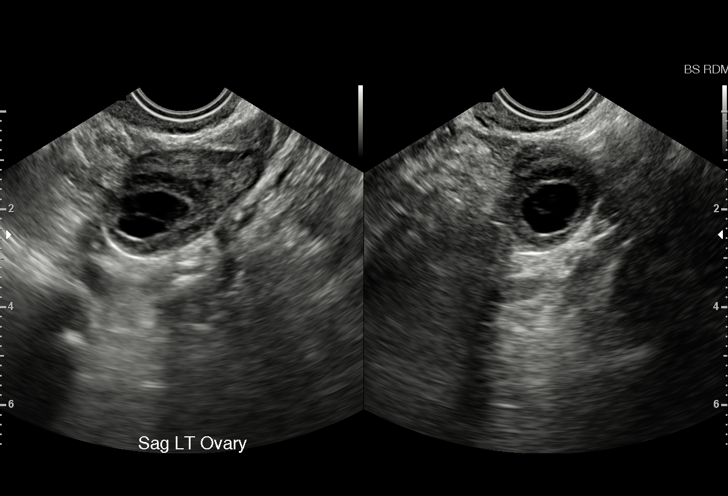
[im 25/25]
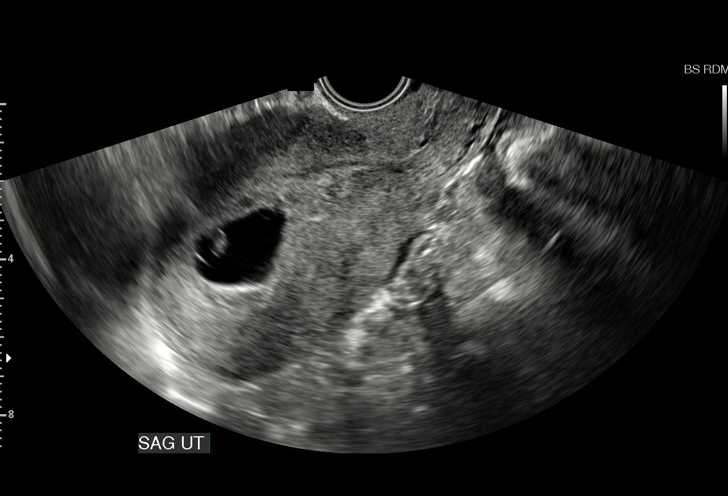

[15 of 25 positions shown; findings below may reference images not displayed]

FINDINGS: Intrauterine gestational sac: Visualized/normal in shape.

Yolk sac:  Yes

Embryo:  Yes

Cardiac Activity: No

Heart Rate: Not detectable

CRL:   6  mm   6 w 3 d

Maternal uterus/adnexae: No uterine masses. No subchronic
hemorrhage. No endometrial fluid. Unremarkable ovaries. No adnexal
masses. No pelvic free fluid.
IMPRESSION: There is an intrauterine gestational sac, embryo and yolk sac, but
there is now no detectable fetal heart activity, which was present
on the prior ultrasound. Findings consistent with a failed
intrauterine pregnancy.

## 2022-10-17 ENCOUNTER — Encounter (HOSPITAL_COMMUNITY): Payer: Self-pay

## 2022-10-17 ENCOUNTER — Emergency Department (HOSPITAL_COMMUNITY): Payer: Managed Care, Other (non HMO)

## 2022-10-17 ENCOUNTER — Emergency Department (HOSPITAL_COMMUNITY)
Admission: EM | Admit: 2022-10-17 | Discharge: 2022-10-17 | Disposition: A | Discharge status: Home / Self Care | Payer: Managed Care, Other (non HMO) | Attending: Emergency Medicine | Admitting: Emergency Medicine

## 2022-10-17 DIAGNOSIS — S161XXA Strain of muscle, fascia and tendon at neck level, initial encounter: Secondary | ICD-10-CM | POA: Diagnosis not present

## 2022-10-17 DIAGNOSIS — R930 Abnormal findings on diagnostic imaging of skull and head, not elsewhere classified: Secondary | ICD-10-CM | POA: Diagnosis not present

## 2022-10-17 DIAGNOSIS — Y92410 Unspecified street and highway as the place of occurrence of the external cause: Secondary | ICD-10-CM | POA: Insufficient documentation

## 2022-10-17 DIAGNOSIS — M549 Dorsalgia, unspecified: Secondary | ICD-10-CM | POA: Diagnosis not present

## 2022-10-17 DIAGNOSIS — S199XXA Unspecified injury of neck, initial encounter: Secondary | ICD-10-CM | POA: Diagnosis present

## 2022-10-17 MED ORDER — CYCLOBENZAPRINE HCL 10 MG PO TABS: 10.0000 mg | ORAL_TABLET | Freq: Two times a day (BID) | ORAL | 0 refills | Status: AC | PRN
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
## 2022-10-17 NOTE — ED Notes (Signed)
Patient transported to CT 
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
## 2022-10-17 NOTE — ED Notes (Signed)
Informed RN of SpO2 not reading properly. RN Randall Hiss informed me the cord for pt was partially working.
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
